# Patient Record
Sex: Male | Born: 1960 | Race: White | Hispanic: No | Smoking: Current every day smoker
Health system: Southern US, Community
[De-identification: ages and names within clinical notes are randomized; demographics above are authoritative.]

## PROBLEM LIST (undated history)

## (undated) DIAGNOSIS — I350 Nonrheumatic aortic (valve) stenosis: Secondary | ICD-10-CM

## (undated) DIAGNOSIS — I10 Essential (primary) hypertension: Secondary | ICD-10-CM

## (undated) DIAGNOSIS — C801 Malignant (primary) neoplasm, unspecified: Secondary | ICD-10-CM

## (undated) DIAGNOSIS — E119 Type 2 diabetes mellitus without complications: Secondary | ICD-10-CM

## (undated) DIAGNOSIS — D689 Coagulation defect, unspecified: Secondary | ICD-10-CM

## (undated) DIAGNOSIS — R0902 Hypoxemia: Secondary | ICD-10-CM

## (undated) DIAGNOSIS — J449 Chronic obstructive pulmonary disease, unspecified: Secondary | ICD-10-CM

## (undated) DIAGNOSIS — M1990 Unspecified osteoarthritis, unspecified site: Secondary | ICD-10-CM

## (undated) DIAGNOSIS — E785 Hyperlipidemia, unspecified: Secondary | ICD-10-CM

## (undated) DIAGNOSIS — G4730 Sleep apnea, unspecified: Secondary | ICD-10-CM

## (undated) HISTORY — DX: Coagulation defect, unspecified: D68.9

## (undated) HISTORY — DX: Hyperlipidemia, unspecified: E78.5

## (undated) HISTORY — DX: Malignant (primary) neoplasm, unspecified: C80.1

## (undated) HISTORY — DX: Chronic obstructive pulmonary disease, unspecified: J44.9

## (undated) HISTORY — DX: Essential (primary) hypertension: I10

## (undated) HISTORY — PX: KNEE SURGERY: SHX244

## (undated) HISTORY — DX: Sleep apnea, unspecified: G47.30

## (undated) HISTORY — DX: Unspecified osteoarthritis, unspecified site: M19.90

## (undated) HISTORY — DX: Hypoxemia: R09.02

## (undated) HISTORY — PX: OTHER SURGICAL HISTORY: SHX169

---

## 2013-06-29 ENCOUNTER — Emergency Department: Payer: Self-pay | Admitting: Emergency Medicine

## 2013-06-29 LAB — BASIC METABOLIC PANEL WITH GFR
Anion Gap: 6 — ABNORMAL LOW
BUN: 9 mg/dL
Calcium, Total: 9.6 mg/dL
Chloride: 99 mmol/L
Co2: 32 mmol/L
Creatinine: 0.67 mg/dL
EGFR (African American): >60
EGFR (Non-African Amer.): >60
Glucose: 285 mg/dL — ABNORMAL HIGH
Osmolality: 283
Potassium: 3.8 mmol/L
Sodium: 137 mmol/L

## 2013-06-29 LAB — CBC
HCT: 50.1 % (ref 40.0–52.0)
HGB: 17.1 g/dL (ref 13.0–18.0)
MCH: 30 pg (ref 26.0–34.0)
MCHC: 34.1 g/dL (ref 32.0–36.0)
MCV: 88 fL (ref 80–100)
PLATELETS: 242 10*3/uL (ref 150–440)
RBC: 5.71 10*6/uL (ref 4.40–5.90)
RDW: 13.9 % (ref 11.5–14.5)
WBC: 9.2 10*3/uL (ref 3.8–10.6)

## 2015-01-01 ENCOUNTER — Other Ambulatory Visit: Payer: Self-pay | Admitting: Internal Medicine

## 2015-01-01 DIAGNOSIS — G4730 Sleep apnea, unspecified: Secondary | ICD-10-CM

## 2015-01-01 DIAGNOSIS — J449 Chronic obstructive pulmonary disease, unspecified: Secondary | ICD-10-CM

## 2015-01-15 ENCOUNTER — Other Ambulatory Visit: Payer: Self-pay | Admitting: Internal Medicine

## 2015-01-15 ENCOUNTER — Ambulatory Visit
Admission: RE | Admit: 2015-01-15 | Discharge: 2015-01-15 | Disposition: A | Discharge status: Home / Self Care | Payer: Disability Insurance | Source: Ambulatory Visit | Attending: Internal Medicine | Admitting: Internal Medicine

## 2015-01-15 ENCOUNTER — Ambulatory Visit: Payer: Disability Insurance

## 2015-01-15 DIAGNOSIS — M47816 Spondylosis without myelopathy or radiculopathy, lumbar region: Secondary | ICD-10-CM | POA: Diagnosis not present

## 2015-01-15 DIAGNOSIS — I70208 Unspecified atherosclerosis of native arteries of extremities, other extremity: Secondary | ICD-10-CM | POA: Insufficient documentation

## 2015-01-15 DIAGNOSIS — M545 Low back pain: Secondary | ICD-10-CM | POA: Diagnosis present

## 2015-01-15 DIAGNOSIS — M5136 Other intervertebral disc degeneration, lumbar region: Secondary | ICD-10-CM

## 2015-01-15 DIAGNOSIS — M1712 Unilateral primary osteoarthritis, left knee: Secondary | ICD-10-CM | POA: Insufficient documentation

## 2015-01-15 DIAGNOSIS — M2240 Chondromalacia patellae, unspecified knee: Secondary | ICD-10-CM | POA: Insufficient documentation

## 2015-01-15 DIAGNOSIS — J449 Chronic obstructive pulmonary disease, unspecified: Secondary | ICD-10-CM

## 2015-01-15 DIAGNOSIS — G4730 Sleep apnea, unspecified: Secondary | ICD-10-CM

## 2015-01-15 DIAGNOSIS — J984 Other disorders of lung: Secondary | ICD-10-CM

## 2016-11-16 ENCOUNTER — Emergency Department: Payer: Medicare Other

## 2016-11-16 ENCOUNTER — Encounter: Payer: Self-pay | Admitting: Emergency Medicine

## 2016-11-16 ENCOUNTER — Emergency Department
Admission: EM | Admit: 2016-11-16 | Discharge: 2016-11-16 | Disposition: A | Discharge status: Home / Self Care | Payer: Medicare Other | Attending: Emergency Medicine | Admitting: Emergency Medicine

## 2016-11-16 DIAGNOSIS — F17210 Nicotine dependence, cigarettes, uncomplicated: Secondary | ICD-10-CM | POA: Diagnosis not present

## 2016-11-16 DIAGNOSIS — Z79899 Other long term (current) drug therapy: Secondary | ICD-10-CM | POA: Insufficient documentation

## 2016-11-16 DIAGNOSIS — J029 Acute pharyngitis, unspecified: Secondary | ICD-10-CM | POA: Diagnosis present

## 2016-11-16 DIAGNOSIS — Z7984 Long term (current) use of oral hypoglycemic drugs: Secondary | ICD-10-CM | POA: Insufficient documentation

## 2016-11-16 DIAGNOSIS — J020 Streptococcal pharyngitis: Secondary | ICD-10-CM | POA: Diagnosis not present

## 2016-11-16 DIAGNOSIS — E119 Type 2 diabetes mellitus without complications: Secondary | ICD-10-CM | POA: Diagnosis not present

## 2016-11-16 HISTORY — DX: Type 2 diabetes mellitus without complications: E11.9

## 2016-11-16 LAB — CBC WITH DIFFERENTIAL/PLATELET
Basophils Absolute: 0.1 10*3/uL (ref 0–0.1)
Basophils Relative: 1 %
Eosinophils Absolute: 0.1 10*3/uL (ref 0–0.7)
Eosinophils Relative: 0 %
HCT: 50.6 % (ref 40.0–52.0)
Hemoglobin: 17.1 g/dL (ref 13.0–18.0)
Lymphocytes Relative: 12 %
Lymphs Abs: 2.2 10*3/uL (ref 1.0–3.6)
MCH: 29.3 pg (ref 26.0–34.0)
MCHC: 33.8 g/dL (ref 32.0–36.0)
MCV: 86.5 fL (ref 80.0–100.0)
Monocytes Absolute: 1.3 10*3/uL — ABNORMAL HIGH (ref 0.2–1.0)
Monocytes Relative: 7 %
Neutro Abs: 14.8 10*3/uL — ABNORMAL HIGH (ref 1.4–6.5)
Neutrophils Relative %: 80 %
Platelets: 260 10*3/uL (ref 150–440)
RBC: 5.85 MIL/uL (ref 4.40–5.90)
RDW: 15.1 % — ABNORMAL HIGH (ref 11.5–14.5)
WBC: 18.5 10*3/uL — ABNORMAL HIGH (ref 3.8–10.6)

## 2016-11-16 LAB — COMPREHENSIVE METABOLIC PANEL
ALT: 16 U/L — ABNORMAL LOW (ref 17–63)
AST: 16 U/L (ref 15–41)
Albumin: 3.4 g/dL — ABNORMAL LOW (ref 3.5–5.0)
Alkaline Phosphatase: 87 U/L (ref 38–126)
Anion gap: 12 (ref 5–15)
BUN: 18 mg/dL (ref 6–20)
CO2: 26 mmol/L (ref 22–32)
Calcium: 9.2 mg/dL (ref 8.9–10.3)
Chloride: 95 mmol/L — ABNORMAL LOW (ref 101–111)
Creatinine, Ser: 0.65 mg/dL (ref 0.61–1.24)
GFR calc Af Amer: >60 mL/min (ref >60)
GFR calc non Af Amer: >60 mL/min (ref >60)
Glucose, Bld: 122 mg/dL — ABNORMAL HIGH (ref 65–99)
Potassium: 4 mmol/L (ref 3.5–5.1)
Sodium: 133 mmol/L — ABNORMAL LOW (ref 135–145)
Total Bilirubin: 1 mg/dL (ref 0.3–1.2)
Total Protein: 8.4 g/dL — ABNORMAL HIGH (ref 6.5–8.1)

## 2016-11-16 LAB — POCT RAPID STREP A: Streptococcus, Group A Screen (Direct): POSITIVE — AB

## 2016-11-16 MED ORDER — DEXTROSE 5 % IV SOLN
2.0000 g | Freq: Once | INTRAVENOUS | Status: AC
  Administered 2016-11-16: 2 g via INTRAVENOUS
  Filled 2016-11-16: qty 2

## 2016-11-16 MED ORDER — SODIUM CHLORIDE 0.9 % IV BOLUS (SEPSIS)
1000.0000 mL | Freq: Once | INTRAVENOUS | Status: AC
  Administered 2016-11-16: 1000 mL via INTRAVENOUS

## 2016-11-16 MED ORDER — PREDNISONE 10 MG (21) PO TBPK: ORAL_TABLET | Freq: Every day | ORAL | 0 refills | Status: AC

## 2016-11-16 MED ORDER — PIPERACILLIN-TAZOBACTAM 3.375 G IVPB 30 MIN
3.3750 g | Freq: Once | INTRAVENOUS | Status: AC
  Administered 2016-11-16: 3.375 g via INTRAVENOUS
  Filled 2016-11-16: qty 50

## 2016-11-16 MED ORDER — DEXAMETHASONE SODIUM PHOSPHATE 10 MG/ML IJ SOLN: 10.0000 mg | Freq: Once | INTRAMUSCULAR | Status: DC

## 2016-11-16 MED ORDER — AMOXICILLIN-POT CLAVULANATE 600-42.9 MG/5ML PO SUSR: 600.0000 mg | Freq: Two times a day (BID) | ORAL | 0 refills | Status: AC

## 2016-11-16 MED ORDER — AMOXICILLIN 400 MG/5ML PO SUSR: 500.0000 mg | Freq: Three times a day (TID) | ORAL | 0 refills | Status: AC

## 2016-11-16 MED ORDER — IOPAMIDOL (ISOVUE-300) INJECTION 61%
75.0000 mL | Freq: Once | INTRAVENOUS | Status: AC | PRN
  Administered 2016-11-16: 75 mL via INTRAVENOUS
  Filled 2016-11-16: qty 75

## 2016-11-16 MED ORDER — CEFTRIAXONE SODIUM 1 G IJ SOLR
2.0000 g | Freq: Once | INTRAMUSCULAR | Status: DC
  Filled 2016-11-16: qty 20

## 2016-11-16 MED ORDER — METHYLPREDNISOLONE SODIUM SUCC 125 MG IJ SOLR
125.0000 mg | Freq: Once | INTRAMUSCULAR | Status: AC
  Administered 2016-11-16: 125 mg via INTRAMUSCULAR
  Filled 2016-11-16 (×2): qty 2

## 2016-11-16 NOTE — ED Provider Notes (Signed)
[facility] Emergency Department Provider Note  ____________________________________________  Time seen: Approximately 5:04 PM  I have reviewed the triage vital signs and the nursing notes.   HISTORY  Chief Complaint Sore Throat    HPI Jerome [name] is a 56 y.o. male with a history of diabetes presents to the emergency department with 10 out of 10 pharyngitis for the past 3 days. Patient has noticed tonsillar exudate. Patient states that he has experienced sweats and chills.Patient states that he has been unable to swallow. Patient states that he can't even "manage a small amount of water". Patient denies a history of peritonsillar abscesses. He has noticed no voice changes. Patient states that he has been coughing since pharyngitis started. Patient denies associated chest pain, chest tightness, nausea, vomiting or diarrhea. No alleviating measures have been attempted.   Past Medical History:  Diagnosis Date  . Diabetes mellitus without complication (HCC)     There are no active problems to display for this patient.   Past Surgical History:  Procedure Laterality Date  . KNEE SURGERY Left     Prior to Admission medications   Medication Sig Start Date End Date Taking? Authorizing Provider  empagliflozin (JARDIANCE) 10 MG TABS tablet Take 10 mg by mouth daily.   Yes [provider]  glipiZIDE (GLUCOTROL) 10 MG tablet Take 10 mg by mouth daily before breakfast.   Yes [provider]  lisinopril (PRINIVIL,ZESTRIL) 40 MG tablet Take 40 mg by mouth daily.   Yes [provider]  metFORMIN (GLUCOPHAGE) 1000 MG tablet Take 1,000 mg by mouth 2 (two) times daily with a meal.   Yes [provider]  amoxicillin (AMOXIL) 400 MG/5ML suspension Take 6.3 mLs (500 mg total) by mouth 3 (three) times daily. 11/16/16 11/26/16  Orvil Feil, PA-C  amoxicillin-clavulanate (AUGMENTIN ES-600) 600-42.9 MG/5ML suspension Take 5 mLs (600 mg  total) by mouth 2 (two) times daily. 11/16/16 11/26/16  Orvil Feil, PA-C  predniSONE (STERAPRED UNI-PAK 21 TAB) 10 MG (21) TBPK tablet Take by mouth daily. Take 6 tabs the the 1st day. Take 6 tabs the the 2nd day. Take 5 tabs the the 3rd day. Take 5 tabs the 4th day. Take 4 tabs the the 5th day.Take 4 tabs the the 6th day.Take 3 tabs the 7th day.Take 3 tabs the 8th day. Take 2 tabs the 9th day. Take 2 tabs the 10th day. Take 1 tab the 11th day. Take 1 tab the 12th day. 11/16/16 11/28/16  Orvil Feil, PA-C    Allergies Patient has no known allergies.  No family history on file.  Social History Social History  Substance Use Topics  . Smoking status: Current Every Day Smoker    Packs/day: 0.30    Types: Cigarettes  . Smokeless tobacco: Not on file  . Alcohol use Not on file     Review of Systems  Constitutional: patient has had chills and swears. Eyes: No visual changes. No discharge ENT: patient has pharyngitis. Cardiovascular: no chest pain. Respiratory: Patient has productive cough Gastrointestinal: No abdominal pain.  No nausea, no vomiting.  No diarrhea.  No constipation. Genitourinary: Negative for dysuria. No hematuria Musculoskeletal: Negative for musculoskeletal pain. Skin: Negative for rash, abrasions, lacerations, ecchymosis. Neurological: Negative for headaches, focal weakness or numbness.  ____________________________________________   PHYSICAL EXAM:  VITAL SIGNS: ED Triage Vitals  Enc Vitals Group     BP 11/16/16 1611 (!) 142/101     Pulse Rate 11/16/16 1611 100  Resp 11/16/16 1611 20     Temp 11/16/16 1611 98.8 F (37.1 C)     Temp Source 11/16/16 1611 Oral     SpO2 11/16/16 1611 94 %     Weight 11/16/16 1612 (!) 345 lb (156.5 kg)     Height 11/16/16 1612 5\' 10"  (1.778 m)     Head Circumference --      Peak Flow --      Pain Score 11/16/16 1611 10     Pain Loc --      Pain Edu? --      Excl. in GC? --      Constitutional: Alert and oriented.  Well appearing and in no acute distress. Eyes: Conjunctivae are normal. PERRL. EOMI. Head: Atraumatic. ENT:      Ears: Tympanic membranes are pearly bilaterally.      Nose: No congestion/rhinnorhea.      Mouth/Throat: Mucous membranes are moist. Posterior pharynx is erythematous with the right tonsil larger than the left. Left tonsillar exudate visualized. Bilateral asymmetrical tonsillar hypertrophy visualized. Right tonsil 3+. Left tonsil 2+. Uvula is midline.  Neck: Full range of motion. Hematological/Lymphatic/Immunilogical: Palpable  cervical lymphadenopathy. Cardiovascular: Normal rate, regular rhythm. Normal S1 and S2.  Good peripheral circulation. Respiratory: Normal respiratory effort without tachypnea or retractions. Lungs CTAB. Good air entry to the bases with no decreased or absent breath sounds. Musculoskeletal: Full range of motion to all extremities. No gross deformities appreciated. Neurologic:  Normal speech and language. No gross focal neurologic deficits are appreciated.  Skin:  Skin is warm, dry and intact. No rash noted. Psychiatric: Mood and affect are normal. Speech and behavior are normal. Patient exhibits appropriate insight and judgement.   ____________________________________________   LABS (all labs ordered are listed, but only abnormal results are displayed)  Labs Reviewed  CBC WITH DIFFERENTIAL/PLATELET - Abnormal; Notable for the following:       Result Value   WBC 18.5 (*)    RDW 15.1 (*)    Neutro Abs 14.8 (*)    Monocytes Absolute 1.3 (*)    All other components within normal limits  COMPREHENSIVE METABOLIC PANEL - Abnormal; Notable for the following:    Sodium 133 (*)    Chloride 95 (*)    Glucose, Bld 122 (*)    Total Protein 8.4 (*)    Albumin 3.4 (*)    ALT 16 (*)    All other components within normal limits  POCT RAPID STREP A - Abnormal; Notable for the following:    Streptococcus, Group A Screen (Direct) POSITIVE (*)    All other  components within normal limits   ____________________________________________  EKG   ____________________________________________  RADIOLOGY Geraldo Pitter, personally viewed and evaluated these images as part of my medical decision making, as well as reviewing the written report by the radiologist.    Dg Chest 2 View  Result Date: 11/16/2016 CLINICAL DATA:  Sore throat for 2 days EXAM: CHEST  2 VIEW COMPARISON:  None. FINDINGS: Bilateral diffuse interstitial thickening and peribronchial cuffing most concerning for bronchitis. There is no focal consolidation. There is a linear band of airspace disease in the lingula likely reflecting atelectasis. There is no pleural effusion or pneumothorax. The heart and mediastinal contours are unremarkable. The osseous structures are unremarkable. IMPRESSION: Bilateral diffuse interstitial thickening and peribronchial cuffing most concerning for bronchitis. Electronically Signed   By: Elige Ko   On: 11/16/2016 17:36   Ct Soft Tissue Neck W Contrast  Result  Date: 11/16/2016 CLINICAL DATA:  Sore throat for 2 days. EXAM: CT NECK WITH CONTRAST TECHNIQUE: Multidetector CT imaging of the neck was performed using the standard protocol following the bolus administration of intravenous contrast. CONTRAST:  75mL ISOVUE-300 IOPAMIDOL (ISOVUE-300) INJECTION 61% COMPARISON:  None. FINDINGS: Pharynx and larynx: The palatine tonsils are enlarged and irregular. Scattered punctate calcifications are noted, left greater than right. Prominent adenoid tissue is noted. The nasopharyngeal airway is near completely occluded. The oropharyngeal airway is patent. Mucosal thickening extends along the area epiglottic fold bilaterally. Vocal cords are midline and symmetric. Salivary glands: The submandibular and parotid glands are within normal limits bilaterally. Thyroid: Normal Lymph nodes: Bilateral jugulodigastric lymph nodes are noted. Multiple subcentimeter level 2 lymph nodes  are present bilaterally, left greater than right. No significant adenopathy is present otherwise. Vascular: Dense atherosclerotic calcifications are present within the proximal internal carotid artery's bilaterally. Moderate stenosis is suspected on both sides. The contrast bolus is insufficient to quantify the degree of stenosis. Atherosclerotic calcifications are also present at the aortic arch without focal stenosis. Limited intracranial: Within normal limits. Visualized orbits: None Mastoids and visualized paranasal sinuses: The paranasal sinuses are clear. The right mastoid air cells have coalesced. Fluid or soft tissue is present within the right mastoid. There is marked thinning of the tegmen. Extension into the right middle cranial fossa is not excluded. Right middle ear cavity is clear. Skeleton: Multilevel degenerative changes are noted in the cervical spine. Osseous foraminal narrowing is most evident at C5-6 on the right and C5-6 and C6-7 on the left. No focal lytic or blastic lesions are present. No residual maxillary teeth are present. Prominent dental caries and periapical lucencies are noted in the central mandibular incisors. Other dental and periodontal disease is present as well. Upper chest: The lung apices are clear. The superior mediastinum is within normal limits. IMPRESSION: 1. Marked enlargement and irregularity of the palatine tonsils, adenoid tissue, and oropharyngeal mucosa suggesting acute pharyngitis. 2. Reactive type bilateral cervical adenopathy. 3. No discrete mass lesion. 4. Atherosclerotic calcifications with probable moderate proximal ICA stenosis of the bifurcations bilaterally. 5. Right its mastoid coalescence with fluid or soft tissue present and marked thinning of the tegmen with. Invasion into the middle cranial fossa is not excluded. MRI could be used for further evaluation if clinically indicated. 6. Multilevel spondylosis of the cervical spine. 7. Anterior mandibular  dental and periodontal disease as described. Electronically Signed   By: Marin Roberts M.D.   On: 11/16/2016 19:27    ____________________________________________    PROCEDURES  Procedure(s) performed:    Procedures    Medications  cefTRIAXone (ROCEPHIN) 2 g in dextrose 5 % 50 mL IVPB (not administered)  piperacillin-tazobactam (ZOSYN) IVPB 3.375 g (0 g Intravenous Stopped 11/16/16 1906)  sodium chloride 0.9 % bolus 1,000 mL (1,000 mLs Intravenous New Bag/Given 11/16/16 1742)  methylPREDNISolone sodium succinate (SOLU-MEDROL) 125 mg/2 mL injection 125 mg (125 mg Intramuscular Given 11/16/16 1743)  iopamidol (ISOVUE-300) 61 % injection 75 mL (75 mLs Intravenous Contrast Given 11/16/16 1843)     ____________________________________________   INITIAL IMPRESSION / ASSESSMENT AND PLAN / ED COURSE  Pertinent labs & imaging results that were available during my care of the patient were reviewed by me and considered in my medical decision making (see chart for details).  Review of the [place] CSRS was performed in accordance of the [facility] prior to dispensing any controlled drugs.     Assessment and plan: Pharyngitis: Patient presents to the emergency department  with 10 out of 10 pharyngitis for the past 3 days. On physical exam, patient had a right tonsillar enlargement, bilateral tonsillar hypertrophy and tonsillar exudate concerning for streptococcal pharyngitis and potential peritonsillar abscess. A CT soft tissue neck revealed no peritonsillar abscess but findings consistent with pharyngitis. WBC 18.  Dr. Jenne CampusMcQueen the otolaryngologist on-call, was consulted. Dr. Jenne CampusMcQueen reviewed CT scan and recommended Augmentin, amoxicillin and tapered prednisone outpatient. Patient was given 2 g of ceftriaxone and Zosyn in the emergency department. Patient was advised to follow-up with otolaryngology as needed. Strict return precautions were given. Patient voiced understanding regarding these return  precautions. All patient questions were answered.     ____________________________________________  FINAL CLINICAL IMPRESSION(S) / ED DIAGNOSES  Final diagnoses:  Pharyngitis due to Streptococcus species      NEW MEDICATIONS STARTED DURING THIS VISIT:  New Prescriptions   AMOXICILLIN (AMOXIL) 400 MG/5ML SUSPENSION    Take 6.3 mLs (500 mg total) by mouth 3 (three) times daily.   AMOXICILLIN-CLAVULANATE (AUGMENTIN ES-600) 600-42.9 MG/5ML SUSPENSION    Take 5 mLs (600 mg total) by mouth 2 (two) times daily.   PREDNISONE (STERAPRED UNI-PAK 21 TAB) 10 MG (21) TBPK TABLET    Take by mouth daily. Take 6 tabs the the 1st day. Take 6 tabs the the 2nd day. Take 5 tabs the the 3rd day. Take 5 tabs the 4th day. Take 4 tabs the the 5th day.Take 4 tabs the the 6th day.Take 3 tabs the 7th day.Take 3 tabs the 8th day. Take 2 tabs the 9th day. Take 2 tabs the 10th day. Take 1 tab the 11th day. Take 1 tab the 12th day.        This chart was dictated using voice recognition software/Dragon. Despite best efforts to proofread, errors can occur which can change the meaning. Any change was purely unintentional.    Orvil FeilWoods, Jaclyn M, PA-C 11/16/16 2017    Emily FilbertWilliams, Jonathan E, MD 11/16/16 2106

## 2016-11-16 NOTE — ED Notes (Signed)
See triage note  Developed sore throat couple of days ago  Pain has increased over the past couple of days

## 2016-11-16 NOTE — ED Triage Notes (Signed)
States sore throat x 2 days. Patient is in wheelchair due to history knee surgery and decreased mobility and therefore came by EMS.

## 2017-05-31 ENCOUNTER — Ambulatory Visit (INDEPENDENT_AMBULATORY_CARE_PROVIDER_SITE_OTHER): Payer: No Typology Code available for payment source | Admitting: Podiatry

## 2017-05-31 ENCOUNTER — Encounter: Payer: Self-pay | Admitting: Podiatry

## 2017-05-31 DIAGNOSIS — M79675 Pain in left toe(s): Secondary | ICD-10-CM

## 2017-05-31 DIAGNOSIS — B351 Tinea unguium: Secondary | ICD-10-CM

## 2017-05-31 DIAGNOSIS — M79674 Pain in right toe(s): Secondary | ICD-10-CM

## 2017-05-31 DIAGNOSIS — E1159 Type 2 diabetes mellitus with other circulatory complications: Secondary | ICD-10-CM

## 2017-05-31 NOTE — Progress Notes (Signed)
   Subjective:    Patient ID: Jerome [name], male    DOB: 08-Jul-1960, 56 y.o.   MRN: [id number]  HPIthis patient presents the office with chf complaint of long thick painful nails.  He says the nails are painful walking and wearing his shoes.  He says that he is diabetic and is  [facility].  He says that he was last seen by Dr. Graciela HusbandsKlein approximately 6 months ago and he has just now received permission to be seen in this office for his nails.  This patient is in a Oncologistmechanized wheelchair.  He says he is unable to self treat.  He presents to the office for an evaluation and treatment of his painful nails on both feet.    Review of Systems  Constitutional: Positive for unexpected weight change.  Eyes: Positive for visual disturbance.  Musculoskeletal: Positive for arthralgias, back pain, gait problem and myalgias.       Objective:   Physical Exam General Appearance  Alert, conversant and in no acute stress.  Vascular  Dorsalis pedis and posterior pulses are not  palpable  bilaterally.  Capillary return is within normal limits  Bilaterally. Temperature is within normal limits  Bilaterally  Neurologic  Deferred due to scooter.  Nails Thick disfigured discolored nails with subungual debride bilaterally from hallux to fifth toes bilaterally. No evidence of bacterial infection or drainage bilaterally.  Orthopedic  No limitations of motion of motion feet bilaterally.  No crepitus or effusions noted.  No bony pathology or digital deformities noted.  Skin  normotropic skin with no porokeratosis noted bilaterally.  No signs of infections or ulcers noted.          Assessment & Plan:  Onychomycosis  B/L  Diabetes with vascular complications.  IE  Debridement of nails.  RTC 3 months.   Helane GuntherGregory Bettey Muraoka DPM

## 2020-10-04 ENCOUNTER — Emergency Department: Payer: No Typology Code available for payment source

## 2020-10-04 ENCOUNTER — Other Ambulatory Visit: Payer: Self-pay

## 2020-10-04 ENCOUNTER — Encounter: Payer: Self-pay | Admitting: Emergency Medicine

## 2020-10-04 ENCOUNTER — Emergency Department
Admission: EM | Admit: 2020-10-04 | Discharge: 2020-10-04 | Disposition: A | Discharge status: Home / Self Care | Payer: No Typology Code available for payment source | Attending: Emergency Medicine | Admitting: Emergency Medicine

## 2020-10-04 DIAGNOSIS — E119 Type 2 diabetes mellitus without complications: Secondary | ICD-10-CM | POA: Insufficient documentation

## 2020-10-04 DIAGNOSIS — Z794 Long term (current) use of insulin: Secondary | ICD-10-CM | POA: Insufficient documentation

## 2020-10-04 DIAGNOSIS — F17210 Nicotine dependence, cigarettes, uncomplicated: Secondary | ICD-10-CM | POA: Diagnosis not present

## 2020-10-04 DIAGNOSIS — K529 Noninfective gastroenteritis and colitis, unspecified: Secondary | ICD-10-CM | POA: Diagnosis not present

## 2020-10-04 DIAGNOSIS — R109 Unspecified abdominal pain: Secondary | ICD-10-CM

## 2020-10-04 DIAGNOSIS — D72829 Elevated white blood cell count, unspecified: Secondary | ICD-10-CM | POA: Insufficient documentation

## 2020-10-04 DIAGNOSIS — Z7984 Long term (current) use of oral hypoglycemic drugs: Secondary | ICD-10-CM | POA: Diagnosis not present

## 2020-10-04 LAB — CBC
HCT: 54.8 % — ABNORMAL HIGH (ref 39.0–52.0)
Hemoglobin: 18.5 g/dL — ABNORMAL HIGH (ref 13.0–17.0)
MCH: 29.3 pg (ref 26.0–34.0)
MCHC: 33.8 g/dL (ref 30.0–36.0)
MCV: 86.7 fL (ref 80.0–100.0)
Platelets: 307 10*3/uL (ref 150–400)
RBC: 6.32 MIL/uL — ABNORMAL HIGH (ref 4.22–5.81)
RDW: 15.8 % — ABNORMAL HIGH (ref 11.5–15.5)
WBC: 13.6 10*3/uL — ABNORMAL HIGH (ref 4.0–10.5)
nRBC: 0 % (ref 0.0–0.2)

## 2020-10-04 LAB — COMPREHENSIVE METABOLIC PANEL
ALT: 18 U/L (ref 0–44)
AST: 16 U/L (ref 15–41)
Albumin: 3.4 g/dL — ABNORMAL LOW (ref 3.5–5.0)
Alkaline Phosphatase: 69 U/L (ref 38–126)
Anion gap: 9 (ref 5–15)
BUN: 15 mg/dL (ref 6–20)
CO2: 25 mmol/L (ref 22–32)
Calcium: 9.3 mg/dL (ref 8.9–10.3)
Chloride: 101 mmol/L (ref 98–111)
Creatinine, Ser: 0.56 mg/dL — ABNORMAL LOW (ref 0.61–1.24)
GFR, Estimated: >60 mL/min (ref >60)
Glucose, Bld: 73 mg/dL (ref 70–99)
Potassium: 4.2 mmol/L (ref 3.5–5.1)
Sodium: 135 mmol/L (ref 135–145)
Total Bilirubin: 0.6 mg/dL (ref 0.3–1.2)
Total Protein: 7.4 g/dL (ref 6.5–8.1)

## 2020-10-04 LAB — URINALYSIS, COMPLETE (UACMP) WITH MICROSCOPIC
Bacteria, UA: NONE SEEN
Bilirubin Urine: NEGATIVE
Glucose, UA: >=500 mg/dL — AB
Hgb urine dipstick: NEGATIVE
Ketones, ur: 5 mg/dL — AB
Leukocytes,Ua: NEGATIVE
Nitrite: NEGATIVE
Protein, ur: 100 mg/dL — AB
Specific Gravity, Urine: 1.028 (ref 1.005–1.030)
pH: 5 (ref 5.0–8.0)

## 2020-10-04 LAB — LIPASE, BLOOD: Lipase: 25 U/L (ref 11–51)

## 2020-10-04 MED ORDER — DICYCLOMINE HCL 10 MG PO CAPS: 10.0000 mg | ORAL_CAPSULE | Freq: Three times a day (TID) | ORAL | 0 refills | Status: DC | PRN

## 2020-10-04 MED ORDER — IOHEXOL 300 MG/ML  SOLN
125.0000 mL | Freq: Once | INTRAMUSCULAR | Status: AC | PRN
  Administered 2020-10-04: 125 mL via INTRAVENOUS

## 2020-10-04 MED ORDER — SUCRALFATE 1 G PO TABS: 1.0000 g | ORAL_TABLET | Freq: Four times a day (QID) | ORAL | 0 refills | Status: DC

## 2020-10-04 NOTE — ED Provider Notes (Signed)
[facility] Emergency Department Provider Note  ____________________________________________   I have reviewed the triage vital signs and the nursing notes.   HISTORY  Chief Complaint Abdominal Pain   History limited by: Not Limited   HPI Jerome [name] is a 60 y.o. male who presents to the emergency department today because of concern for right sided abdominal pain. The patient states that he started noticing a change to his GI tract a couple of months ago. Previously he had been very regular with his bowel movements, having them every morning without issue after having coffee. However a couple of months ago it started becoming less regular. He then started using a laxative powder to help. Over the past couple of weeks he started also developing some pain in his right abdomen. It would occur pretty soon after he ate. This morning when he started having the pain he noticed some swelling to the right mid abdomen. The patient states that the pain is more severe when he lays flat. Called his doctor who advised that he present to the emergency department.    Records reviewed. Per medical record review patient has a history of DM  Past Medical History:  Diagnosis Date  . Diabetes mellitus without complication (HCC)     There are no problems to display for this patient.   Past Surgical History:  Procedure Laterality Date  . KNEE SURGERY Left     Prior to Admission medications   Medication Sig Start Date End Date Taking? Authorizing Provider  albuterol (PROVENTIL HFA;VENTOLIN HFA) 108 (90 Base) MCG/ACT inhaler Inhale into the lungs.    [provider]  empagliflozin (JARDIANCE) 10 MG TABS tablet Take by mouth.    [provider]  glipiZIDE (GLUCOTROL) 10 MG tablet Take by mouth.    [provider]  insulin glargine (LANTUS) 100 UNIT/ML injection Inject into the skin.    [provider]  lisinopril (PRINIVIL,ZESTRIL) 40 MG  tablet Take by mouth.    [provider]  metFORMIN (GLUCOPHAGE) 1000 MG tablet Take by mouth.    [provider]  oxyCODONE-acetaminophen (PERCOCET/ROXICET) 5-325 MG tablet Take by mouth.    [provider]  varenicline (CHANTIX) 1 MG tablet Take by mouth.    [provider]  zolpidem (AMBIEN) 10 MG tablet Take by mouth.    [provider]    Allergies Patient has no known allergies.  No family history on file.  Social History Social History   Tobacco Use  . Smoking status: Current Every Day Smoker    Packs/day: 0.30    Types: Cigarettes  . Smokeless tobacco: Never Used  Substance Use Topics  . Alcohol use: No  . Drug use: No    Review of Systems Constitutional: No fever/chills Eyes: No visual changes. ENT: No sore throat. Cardiovascular: Denies chest pain. Respiratory: Denies shortness of breath. Gastrointestinal: Positive for abdominal pain.  Genitourinary: Negative for dysuria. Musculoskeletal: Negative for back pain. Skin: Negative for rash. Neurological: Negative for headaches, focal weakness or numbness.  ____________________________________________   PHYSICAL EXAM:  VITAL SIGNS: ED Triage Vitals  Enc Vitals Group     BP 10/04/20 1543 109/64     Pulse Rate 10/04/20 1543 99     Resp 10/04/20 1543 19     Temp 10/04/20 1543 98.6 F (37 C)     Temp Source 10/04/20 1543 Oral     SpO2 10/04/20 1543 94 %     Weight 10/04/20 1535 (!) 345  lb 0.3 oz (156.5 kg)     Height 10/04/20 1535 5\' 10"  (1.778 m)     Head Circumference --      Peak Flow --      Pain Score 10/04/20 1535 6   Constitutional: Alert and oriented.  Eyes: Conjunctivae are normal.  ENT      Head: Normocephalic and atraumatic.      Nose: No congestion/rhinnorhea.      Mouth/Throat: Mucous membranes are moist.      Neck: No stridor. Hematological/Lymphatic/Immunilogical: No cervical lymphadenopathy. Cardiovascular: Normal rate, regular rhythm.  No  murmurs, rubs, or gallops.  Respiratory: Normal respiratory effort without tachypnea nor retractions. Breath sounds are clear and equal bilaterally. No wheezes/rales/rhonchi. Gastrointestinal: Soft and non tender. No rebound. No guarding.  Genitourinary: Deferred Musculoskeletal: Normal range of motion in all extremities. No lower extremity edema. Neurologic:  Normal speech and language. No gross focal neurologic deficits are appreciated.  Skin:  Skin is warm, dry and intact. No rash noted. Psychiatric: Mood and affect are normal. Speech and behavior are normal. Patient exhibits appropriate insight and judgment.  ____________________________________________    LABS (pertinent positives/negatives)  CBC wbc 13.6, hgb 18.5, plt 307 Lipase 25 CMP wnl except cr 0.56, alb 3.4 UA clear, glucose >500, ketones 5, protein 100, rbc and wbc 0-5 ____________________________________________   EKG  None  ____________________________________________    RADIOLOGY  CT abd/pel Wall thickening of proximal jejunum.   ____________________________________________   PROCEDURES  Procedures  ____________________________________________   INITIAL IMPRESSION / ASSESSMENT AND PLAN / ED COURSE  Pertinent labs & imaging results that were available during my care of the patient were reviewed by me and considered in my medical decision making (see chart for details).   Patient presented to the emergency department because of concern for abdominal pain and some swelling to the right abdomen. On exam no significant swelling appreciated. Blood work with very minimal leukocytosis. The patient did have CT scan performed which showed some inflammation of the proximal jejunum, however no significant intraabdominal infection, obstruction or hernia. Discussed finding with patient. Will plan on discharging with medication to help with the patient's symptoms. [facility]  ____________________________________________   FINAL CLINICAL IMPRESSION(S) / ED DIAGNOSES  Final diagnoses:  Abdominal pain, unspecified abdominal location  Enteritis     Note: This dictation was prepared with Dragon dictation. Any transcriptional errors that result from this process are unintentional     10/06/20, MD 10/04/20 1931

## 2020-10-04 NOTE — Discharge Instructions (Addendum)
Please seek medical attention for any high fevers, chest pain, shortness of breath, change in behavior, persistent vomiting, bloody stool or any other new or concerning symptoms.  

## 2020-10-04 NOTE — ED Notes (Signed)
Pt loaded to wheelchair and rolled to lobby to await family pickup

## 2020-10-04 NOTE — ED Notes (Signed)
Pt presents to ED with c/o of ABD pain. Pt states intermittent nausea but nothing consistent. Pt denies fevers and chills. Pt states "I think my RLQ is swollen". Pt denies burning on urination. NAD noted.   Pt asks to remain in the recliner due to comfort.

## 2020-10-04 NOTE — ED Triage Notes (Signed)
C/O abdominal pain x 1 day.  Bulge to RLQ abdomen ( not appreciated by EMS).  ALso c/o pain to RLQ.  VS wnl.  NAD

## 2020-10-04 NOTE — ED Notes (Signed)
Peripheral IV discontinued. Catheter intact. No signs of infiltration or redness. Gauze applied to IV site.    Discharge instructions reviewed with patient. Questions fielded by this RN. Patient verbalizes understanding of instructions. Patient discharged home in stable condition per goodman. No acute distress noted at time of discharge.    

## 2022-11-11 IMAGING — CT CT ABD-PELV W/ CM
2 of 5 series · 17 of 46 positions shown, 19 images · IV contrast (APPLIED)
Comparison: None.

CLINICAL DATA: Right-sided abdominal pain

EXAM:
CT ABDOMEN AND PELVIS WITH CONTRAST
TECHNIQUE: Multidetector CT imaging of the abdomen and pelvis was performed
using the standard protocol following bolus administration of
intravenous contrast.
CONTRAST:  125mL OMNIPAQUE IOHEXOL 300 MG/ML  SOLN

[Series 2: axial st · axial · 0.93mm/px · z∈[-1175,-745]mm · 14 of 98 slices shown, 16 images]
[im 6/98  soft-tissue]
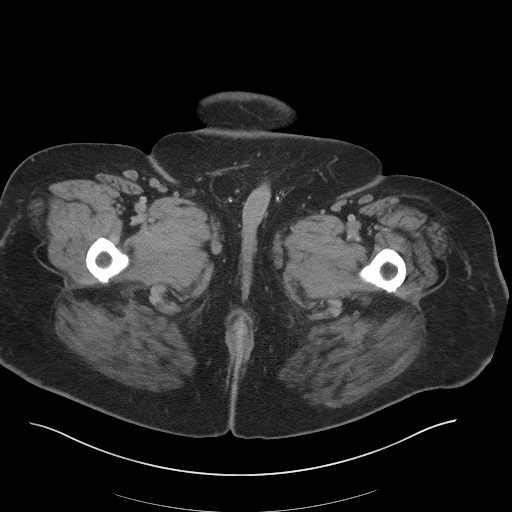
[im 6/98  bone]
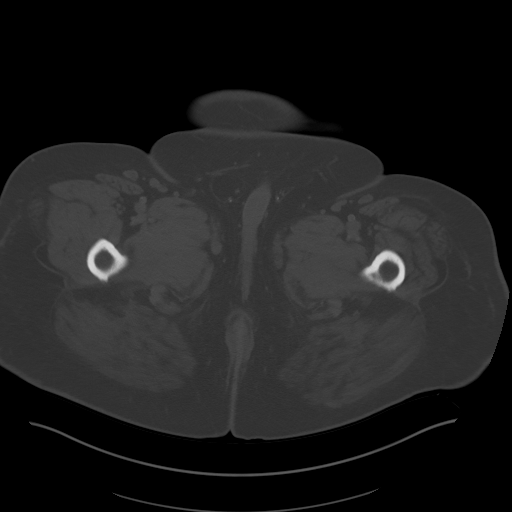
[im 11/98  soft-tissue]
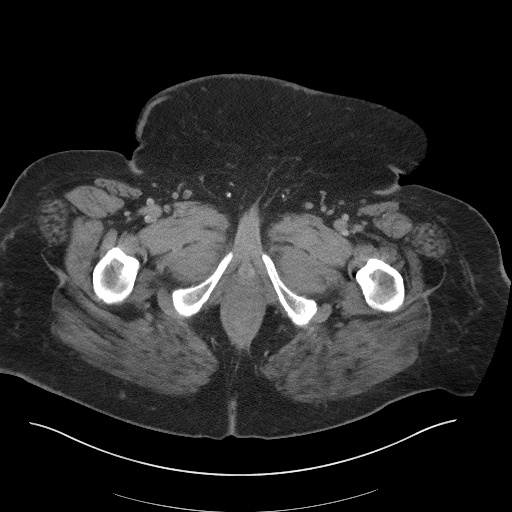
[im 22/98  soft-tissue]
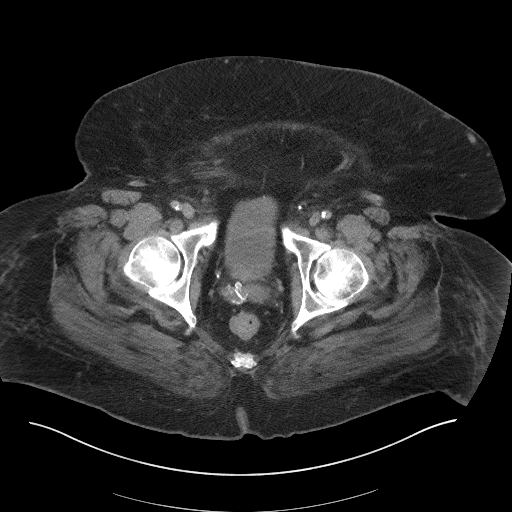
[im 27/98  soft-tissue]
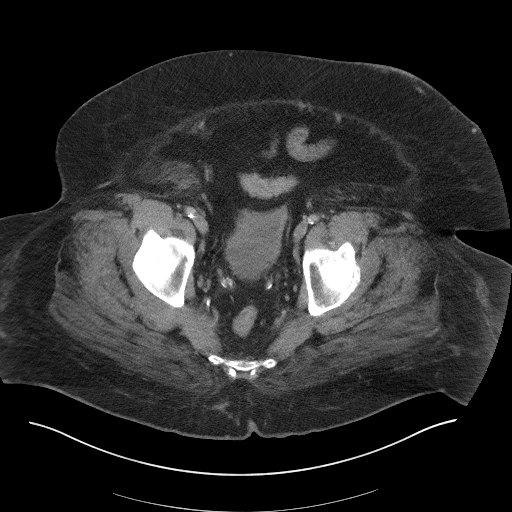
[im 33/98  soft-tissue]
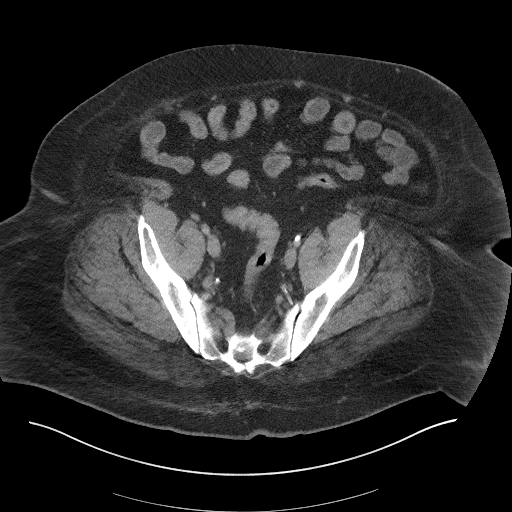
[im 38/98  soft-tissue]
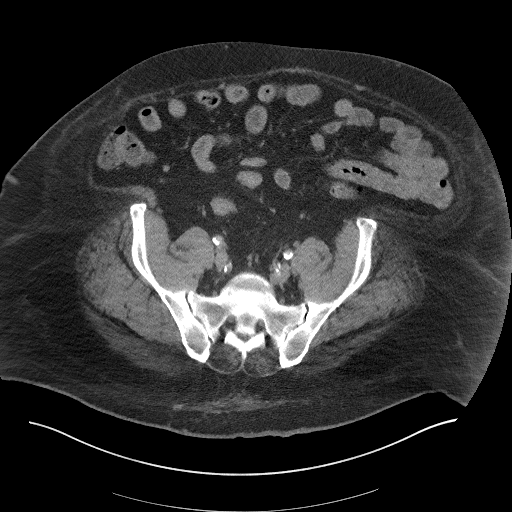
[im 44/98  soft-tissue]
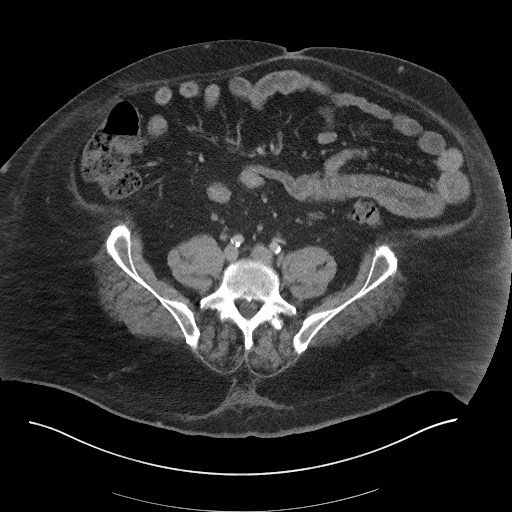
[im 54/98  soft-tissue]
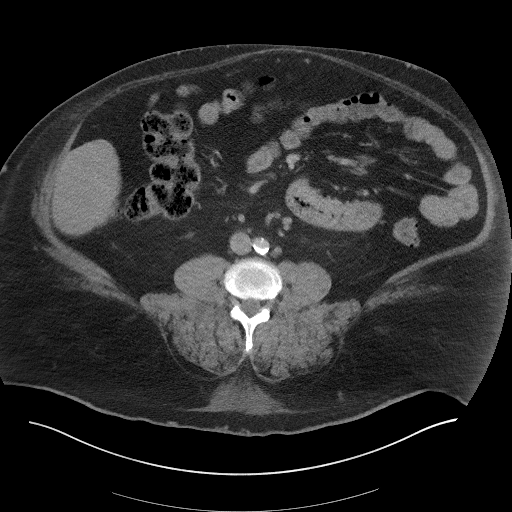
[im 60/98  soft-tissue]
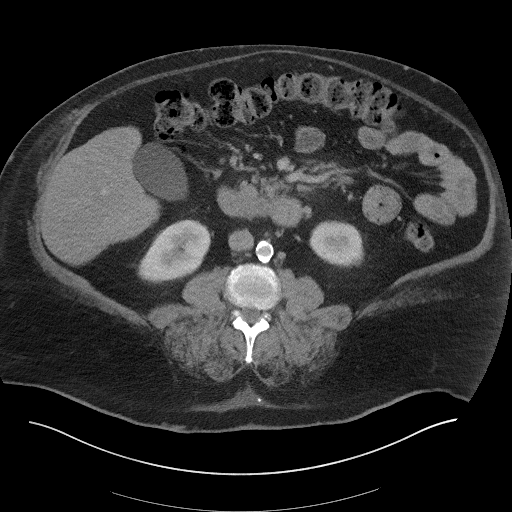
[im 60/98  bone]
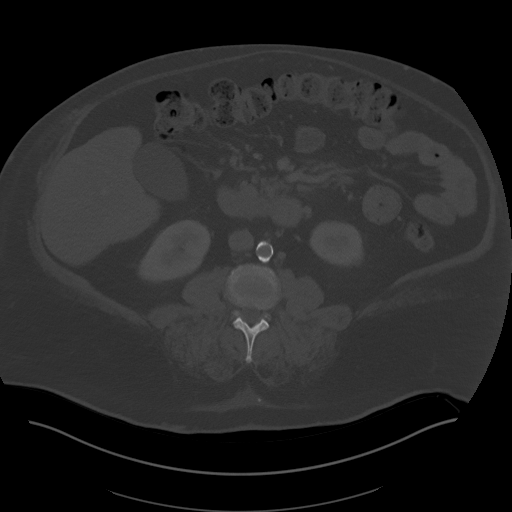
[im 65/98  soft-tissue]
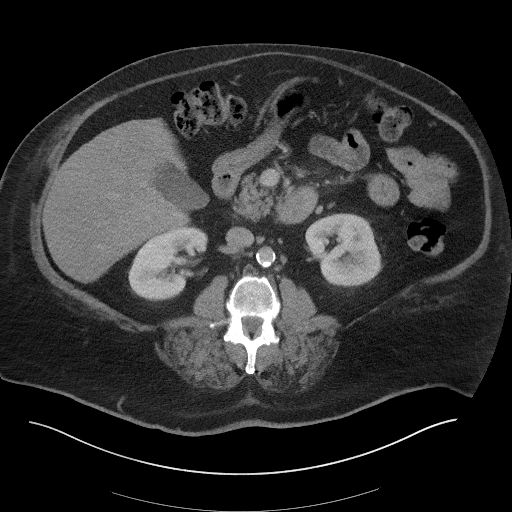
[im 71/98  soft-tissue]
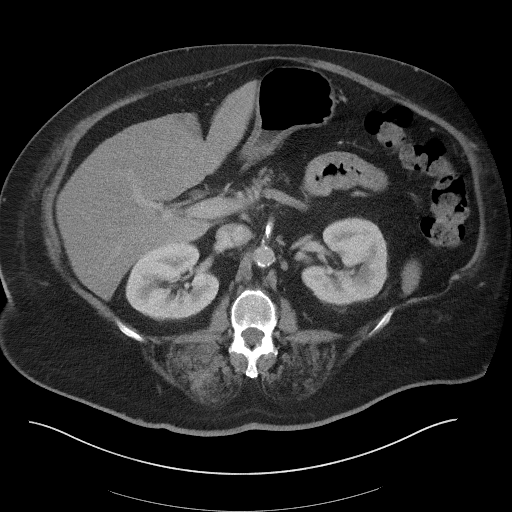
[im 76/98  soft-tissue]
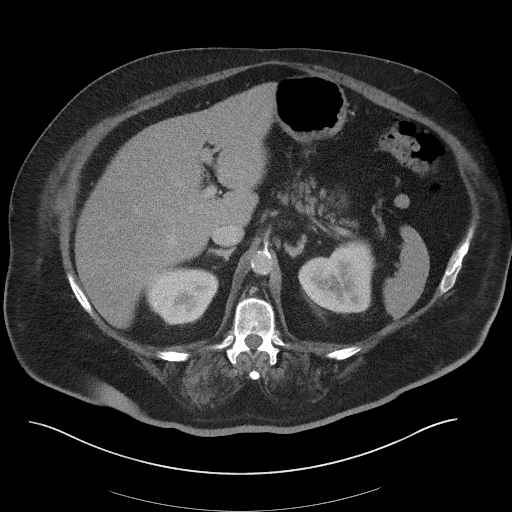
[im 87/98  soft-tissue]
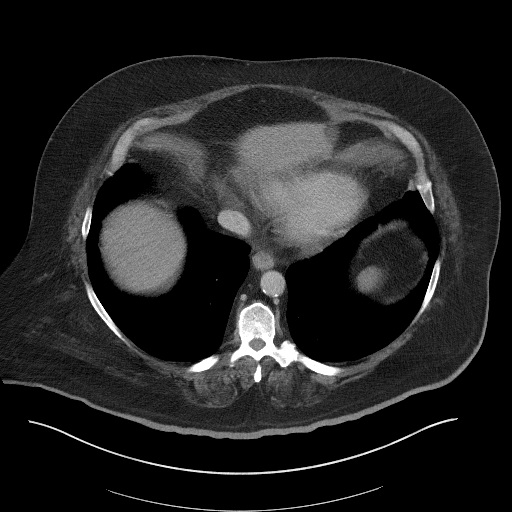
[im 92/98  soft-tissue]
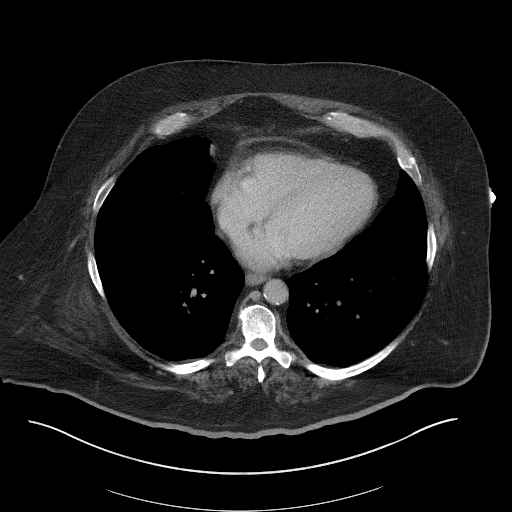

[Series 5: coronal st · coronal · 0.98mm/px · 3 of 124 slices shown]
[im 42/124  soft-tissue]
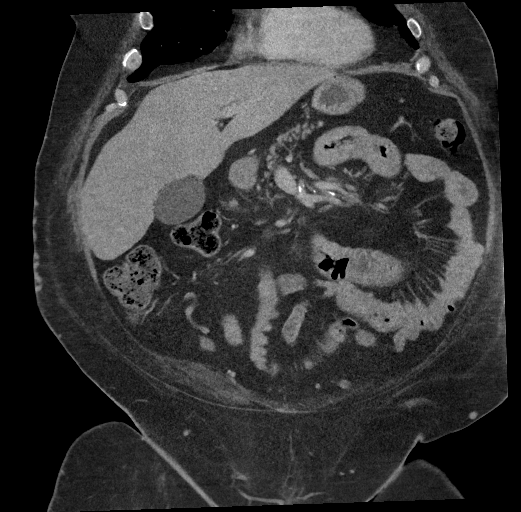
[im 55/124  soft-tissue]
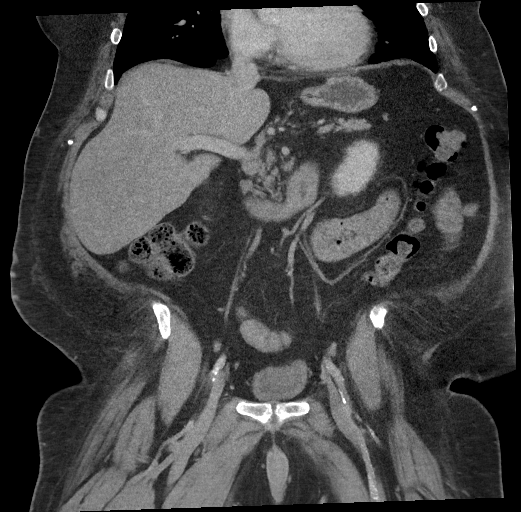
[im 69/124  soft-tissue]
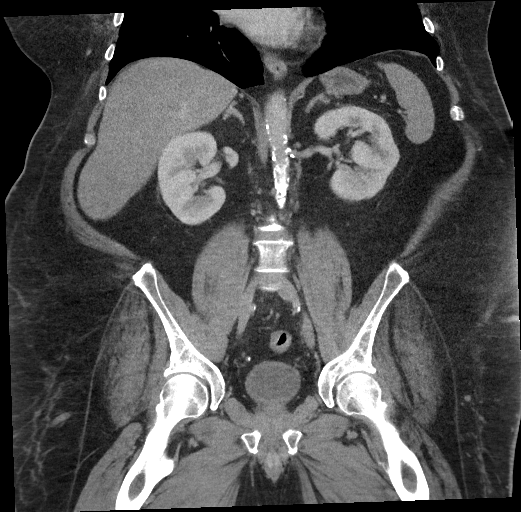

[17 of 46 positions shown; findings below may reference images not displayed]

FINDINGS: Lower chest: No acute abnormality.

Hepatobiliary: No focal liver abnormality is seen. No gallstones,
gallbladder wall thickening, or biliary dilatation. Calcified
granuloma noted within the left hepatic lobe.

Pancreas: Unremarkable. No pancreatic ductal dilatation or
surrounding inflammatory changes.

Spleen: Normal in size without focal abnormality.

Adrenals/Urinary Tract: Normal adrenal glands. Several small stones
identified within the lower pole collecting system of the right
kidney measuring up to 4 mm. No hydronephrosis or mass identified
bilaterally. Urinary bladder is unremarkable.

Stomach/Bowel: Stomach is within normal limits. Appendix appears
normal. Wall thickening and increase caliber of the proximal jejunum
identified concerning for inflammatory or infectious enteritis. The
remaining small bowel loops are unremarkable. Normal appearance of
the colon.

Vascular/Lymphatic: Aortic atherosclerosis. No aneurysm. No
abdominopelvic adenopathy

Reproductive: Prostate is unremarkable.

Other: No free fluid or fluid collections.

Musculoskeletal: No fracture is seen. Degenerative disc disease
noted within the lower thoracic spine.
IMPRESSION: 1. Wall thickening and increase caliber of the proximal jejunum
concerning for inflammatory or infectious enteritis.
2. No acute findings noted within the right lower quadrant of the
abdomen.
3. Nonobstructing right renal calculi.
4. Aortic atherosclerosis.

Aortic Atherosclerosis ([id number]).

## 2023-01-19 ENCOUNTER — Ambulatory Visit
Payer: No Typology Code available for payment source | Attending: Student in an Organized Health Care Education/Training Program | Admitting: Student in an Organized Health Care Education/Training Program

## 2023-01-19 ENCOUNTER — Encounter: Payer: Self-pay | Admitting: Student in an Organized Health Care Education/Training Program

## 2023-01-19 VITALS — BP 117/59 | HR 111 | Temp 99.1°F | Resp 18 | Ht 70.0 in | Wt 285.0 lb

## 2023-01-19 DIAGNOSIS — M25511 Pain in right shoulder: Secondary | ICD-10-CM | POA: Insufficient documentation

## 2023-01-19 DIAGNOSIS — G894 Chronic pain syndrome: Secondary | ICD-10-CM | POA: Insufficient documentation

## 2023-01-19 DIAGNOSIS — Z993 Dependence on wheelchair: Secondary | ICD-10-CM | POA: Diagnosis not present

## 2023-01-19 DIAGNOSIS — Z96652 Presence of left artificial knee joint: Secondary | ICD-10-CM | POA: Insufficient documentation

## 2023-01-19 DIAGNOSIS — M25562 Pain in left knee: Secondary | ICD-10-CM | POA: Insufficient documentation

## 2023-01-19 DIAGNOSIS — F17210 Nicotine dependence, cigarettes, uncomplicated: Secondary | ICD-10-CM | POA: Insufficient documentation

## 2023-01-19 DIAGNOSIS — Z7902 Long term (current) use of antithrombotics/antiplatelets: Secondary | ICD-10-CM | POA: Insufficient documentation

## 2023-01-19 DIAGNOSIS — M47816 Spondylosis without myelopathy or radiculopathy, lumbar region: Secondary | ICD-10-CM | POA: Insufficient documentation

## 2023-01-19 DIAGNOSIS — G8929 Other chronic pain: Secondary | ICD-10-CM | POA: Insufficient documentation

## 2023-01-19 DIAGNOSIS — M1712 Unilateral primary osteoarthritis, left knee: Secondary | ICD-10-CM | POA: Insufficient documentation

## 2023-01-19 DIAGNOSIS — I7090 Unspecified atherosclerosis: Secondary | ICD-10-CM | POA: Insufficient documentation

## 2023-01-19 DIAGNOSIS — M79642 Pain in left hand: Secondary | ICD-10-CM | POA: Insufficient documentation

## 2023-01-19 DIAGNOSIS — Z85118 Personal history of other malignant neoplasm of bronchus and lung: Secondary | ICD-10-CM | POA: Diagnosis not present

## 2023-01-19 DIAGNOSIS — M19011 Primary osteoarthritis, right shoulder: Secondary | ICD-10-CM | POA: Diagnosis not present

## 2023-01-19 DIAGNOSIS — M5459 Other low back pain: Secondary | ICD-10-CM | POA: Insufficient documentation

## 2023-01-19 DIAGNOSIS — M79641 Pain in right hand: Secondary | ICD-10-CM | POA: Diagnosis not present

## 2023-01-19 NOTE — Progress Notes (Signed)
Patient: Jerome [name]  Service Category: E/M  Provider: Edward Jolly, MD  DOB: 1960-06-26  DOS: 01/19/2023  Referring Provider: [facility]  MRN: [id number]  Setting: Ambulatory outpatient  PCP: [facility]  Type: New Patient  Specialty: Interventional Pain Management    Location: Office  Delivery: Face-to-face     Primary Reason(s) for Visit: Encounter for initial evaluation of one or more chronic problems (new to examiner) potentially causing chronic pain, and posing a threat to normal musculoskeletal function. (Level of risk: High) CC: Back Pain (lower), Shoulder Pain (right), Hand Pain (bilat), Knee Pain (left), and Foot Pain (Bilateral neuropathy)  HPI  Jerome [name] is a 62 y.o. year old, male patient, who comes for the first time to our practice referred by Center, [facility]* for our initial evaluation of his chronic pain. He has Chronic pain syndrome; Lumbar facet arthropathy; Chronic right shoulder pain; and Primary osteoarthritis of right shoulder on their problem list. Today he comes in for evaluation of his Back Pain (lower), Shoulder Pain (right), Hand Pain (bilat), Knee Pain (left), and Foot Pain (Bilateral neuropathy)  Pain Assessment: Location: Lower Back Radiating: denies Onset: More than a month ago Duration: Chronic pain Quality: Throbbing, Aching, Burning, Sharp, Shooting Severity: 3 /10 (subjective, self-reported pain score)  Effect on ADL: cannot lie flat; cannot walk more than 20 feet Timing: Constant Modifying factors: limits daily activities BP: (!) 117/59  HR: (!) 111  Onset and Duration: Present longer than 3 months Cause of pain: Unknown Severity: Getting worse, NAS-11 at its worse: 10/10, NAS-11 at its best: 3/10, and NAS-11 on the average: 4/10 Timing: Not influenced by the time of the day, During activity or exercise, After activity or exercise, and After a period of immobility Aggravating Factors:  not indicated Alleviating  Factors: Cold packs, Hot packs, and Medications Associated Problems: Numbness, Swelling, Tingling, Pain that wakes patient up, and Pain that does not allow patient to sleep Quality of Pain: Sharp and Uncomfortable Previous Examinations or Tests: CT scan, MRI scan, X-rays, and Orthopedic evaluation Previous Treatments: The patient denies    Jerome [name] is a 62 year old male who presents with multiple pain generators including his low back, right shoulder, 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  He also has a history of abdominal stents for mesenteric ischemia and is currently on Plavix.  He also has a history of lung cancer.  Patient states that he is wheelchair-bound and cannot ambulate without assistance.  He states that in 2015 he sustained a fall over ice and fell down many flights of stairs and injured his left knee which led him to have a left knee replacement.  He has low back pain related to lumbar spondylosis.  His lumbar MRI shows multilevel lumbar spondylosis most pronounced at L1 and L2 along with multilevel lumbar degenerative disc disease.  He has been diagnosed with right shoulder osteoarthritis and has done physical therapy in the past.  He is currently not on any opioid analgesics but will take 5 mg of oxycodone for breakthrough pain whenever it occurred.  No issues with compliance in the past he was prescribed this at the [place].   Historic Controlled Substance Pharmacotherapy Review  PMP and historical list of controlled substances: Oxycodone 5 mg daily [facility] Historical Monitoring: The patient  reports no history of drug use. List of prior UDS Testing: No results found for: "  MDMA", "COCAINSCRNUR", "PCPSCRNUR", "PCPQUANT", "CANNABQUANT", "THCU", "ETH", "CBDTHCR", "D8THCCBX", "D9THCCBX" Historical Background Evaluation: [place] PMP: PDMP reviewed during this encounter. Review of the past  63-months conducted.             PMP NARX Score Report:  [place] Department of public safety, offender search: Engineer, mining Information) Non-contributory Risk Assessment Profile: Aberrant behavior: None observed or detected today Risk factors for fatal opioid overdose: None identified today Fatal overdose hazard ratio (HR): Calculation deferred Non-fatal overdose hazard ratio (HR): Calculation deferred Risk of opioid abuse or dependence: 0.7-3.0% with doses ? 36 MME/day and 6.1-26% with doses ? 120 MME/day. Substance use disorder (SUD) risk level: Low   Pharmacologic Plan: As per protocol, I have not taken over any controlled substance management, pending the results of ordered tests and/or consults.            Initial impression: Pending review of available data and ordered tests.  Meds   Current Outpatient Medications:    albuterol (PROVENTIL HFA;VENTOLIN HFA) 108 (90 Base) MCG/ACT inhaler, Inhale into the lungs., Disp: , Rfl:    aspirin EC 81 MG tablet, Take 81 mg by mouth daily. Swallow whole., Disp: , Rfl:    ATORVASTATIN CALCIUM PO, Take by mouth., Disp: , Rfl:    clopidogrel (PLAVIX) 75 MG tablet, Take 75 mg by mouth daily., Disp: , Rfl:    Empagliflozin-metFORMIN HCl 12.10-998 MG TABS, Take by mouth., Disp: , Rfl:    gabapentin (NEURONTIN) 800 MG tablet, Take 800 mg by mouth daily as needed., Disp: , Rfl:    glipiZIDE (GLUCOTROL) 10 MG tablet, Take by mouth., Disp: , Rfl:    insulin glargine (LANTUS) 100 UNIT/ML injection, Inject into the skin., Disp: , Rfl:    lisinopril (PRINIVIL,ZESTRIL) 40 MG tablet, Take by mouth., Disp: , Rfl:    omeprazole (PRILOSEC) 20 MG capsule, Take 20 mg by mouth daily., Disp: , Rfl:    senna (SENOKOT) 8.6 MG TABS tablet, Take 1 tablet by mouth., Disp: , Rfl:    zolpidem (AMBIEN) 10 MG tablet, Take by mouth., Disp: , Rfl:   Imaging Review   DG Lumbar Spine 2-3 Views  Narrative CLINICAL DATA:  Low back pain.  EXAM: LUMBAR SPINE - 2-3  VIEW  COMPARISON:  None.  FINDINGS: Paraspinal soft tissues are normal. No acute bony abnormality identified. Normal alignment. Multilevel degenerative change. Aortoiliac atherosclerotic vascular disease.  IMPRESSION: 1.  Multilevel degenerative change.  No acute abnormality.  2.  Aortoiliac atherosclerotic vascular disease.   Electronically Signed By: Maisie Fus  Register On: 01/15/2015 15:42   DG Knee 1-2 Views Left  Narrative CLINICAL DATA:  previous patella surgery, difficulty walking and standing, left knee pain radiates all over  EXAM: LEFT KNEE - 1-2 VIEW  COMPARISON:  06/29/2013  FINDINGS: No acute fracture. Previous transverse fracture of the patella is noted. There is bony bridging across did portion of the fracture. Two screws have been inserted from superior to inferior, maintaining the patellar fracture fragments in near anatomic alignment. The screws are well-seated with no evidence of loosening. There is some irregularity along the subchondral surface of the dorsal patella consistent with posttraumatic chondromalacia.  There is moderate medial joint space compartment narrowing with medial compartment marginal spurring.  Bones are demineralized.  No bone lesion.  No joint effusion.  IMPRESSION: 1. No acute fracture. 2. Previous ORIF of the transverse patellar fracture. Two fixation screws are well-seated with no evidence of loosening. 3. Evidence of posttraumatic patellar chondromalacia. This may be the  source of chronic pain. 4. There is also moderate osteoarthritis affecting the medial compartment. Medial compartment findings are similar to the prior knee radiographs.   Electronically Signed By: Amie Portland M.D. On: 01/15/2015 15:48   Complexity Note: Imaging results reviewed.                         ROS  Cardiovascular: Daily Aspirin intake, High blood pressure, and Blood thinners:  Antiplatelet Pulmonary or Respiratory: Lung problems,  Difficulty blowing air out (Emphysema), Shortness of breath, Smoking, and Temporary stoppage of breathing during sleep Neurological: Abnormal skin sensations (Peripheral Neuropathy) Psychological-Psychiatric: No reported psychological or psychiatric signs or symptoms such as difficulty sleeping, anxiety, depression, delusions or hallucinations (schizophrenial), mood swings (bipolar disorders) or suicidal ideations or attempts Gastrointestinal: Vomiting blood (Ulcers), Reflux or heatburn, and Irregular, infrequent bowel movements (Constipation) Genitourinary: No reported renal or genitourinary signs or symptoms such as difficulty voiding or producing urine, peeing blood, non-functioning kidney, kidney stones, difficulty emptying the bladder, difficulty controlling the flow of urine, or chronic kidney disease Hematological: Bleeding easily Endocrine: High blood sugar requiring insulin (IDDM) Rheumatologic: No reported rheumatological signs and symptoms such as fatigue, joint pain, tenderness, swelling, redness, heat, stiffness, decreased range of motion, with or without associated rash Musculoskeletal: Negative for myasthenia gravis, muscular dystrophy, multiple sclerosis or malignant hyperthermia Work History: Disabled  Allergies  Jerome [name] has No Known Allergies.  Laboratory Chemistry Profile   Renal Lab Results  Component Value Date   BUN 15 10/04/2020   CREATININE 0.56 (L) 10/04/2020   GFRAA >60 11/16/2016   GFRNONAA >60 10/04/2020   PROTEINUR 100 (A) 10/04/2020     Electrolytes Lab Results  Component Value Date   NA 135 10/04/2020   K 4.2 10/04/2020   CL 101 10/04/2020   CALCIUM 9.3 10/04/2020     Hepatic Lab Results  Component Value Date   AST 16 10/04/2020   ALT 18 10/04/2020   ALBUMIN 3.4 (L) 10/04/2020   ALKPHOS 69 10/04/2020   LIPASE 25 10/04/2020     ID No results found for: "LYMEIGGIGMAB", "HIV", "SARSCOV2NAA", "STAPHAUREUS", "MRSAPCR", "HCVAB", "PREGTESTUR",  "RMSFIGG", "QFVRPH1IGG", "QFVRPH2IGG"   Bone No results found for: "VD25OH", "VD125OH2TOT", "VH8469GE9", "BM8413KG4", "25OHVITD1", "25OHVITD2", "25OHVITD3", "TESTOFREE", "TESTOSTERONE"   Endocrine Lab Results  Component Value Date   GLUCOSE 73 10/04/2020   GLUCOSEU >=500 (A) 10/04/2020     Neuropathy No results found for: "VITAMINB12", "FOLATE", "HGBA1C", "HIV"   CNS No results found for: "COLORCSF", "APPEARCSF", "RBCCOUNTCSF", "WBCCSF", "POLYSCSF", "LYMPHSCSF", "EOSCSF", "PROTEINCSF", "GLUCCSF", "JCVIRUS", "CSFOLI", "IGGCSF", "LABACHR", "ACETBL"   Inflammation (CRP: Acute  ESR: Chronic) No results found for: "CRP", "ESRSEDRATE", "LATICACIDVEN"   Rheumatology No results found for: "RF", "ANA", "LABURIC", "URICUR", "LYMEIGGIGMAB", "LYMEABIGMQN", "HLAB27"   Coagulation Lab Results  Component Value Date   PLT 307 10/04/2020     Cardiovascular Lab Results  Component Value Date   HGB 18.5 (H) 10/04/2020   HCT 54.8 (H) 10/04/2020     Screening No results found for: "SARSCOV2NAA", "COVIDSOURCE", "STAPHAUREUS", "MRSAPCR", "HCVAB", "HIV", "PREGTESTUR"   Cancer No results found for: "CEA", "CA125", "LABCA2"   Allergens No results found for: "ALMOND", "APPLE", "ASPARAGUS", "AVOCADO", "BANANA", "BARLEY", "BASIL", "BAYLEAF", "GREENBEAN", "LIMABEAN", "WHITEBEAN", "BEEFIGE", "REDBEET", "BLUEBERRY", "BROCCOLI", "CABBAGE", "MELON", "CARROT", "CASEIN", "CASHEWNUT", "CAULIFLOWER", "CELERY"     Note: Lab results reviewed.  PFSH  Drug: Jerome [name]  reports no history of drug use. Alcohol:  reports no history of alcohol use. Tobacco:  reports that he has  been smoking cigarettes. He has never used smokeless tobacco. Medical:  has a past medical history of Arthritis, Cancer (HCC), Clotting disorder (HCC), COPD (chronic obstructive pulmonary disease) (HCC), Diabetes mellitus without complication (HCC), Hyperlipidemia, Hypertension, Oxygen deficiency, and Sleep apnea. Family: family history is  not on file.  Past Surgical History:  Procedure Laterality Date   KNEE SURGERY Left    Active Ambulatory Problems    Diagnosis Date Noted   Chronic pain syndrome 01/19/2023   Lumbar facet arthropathy 01/19/2023   Chronic right shoulder pain 01/19/2023   Primary osteoarthritis of right shoulder 01/19/2023   Resolved Ambulatory Problems    Diagnosis Date Noted   No Resolved Ambulatory Problems   Past Medical History:  Diagnosis Date   Arthritis    Cancer (HCC)    Clotting disorder (HCC)    COPD (chronic obstructive pulmonary disease) (HCC)    Diabetes mellitus without complication (HCC)    Hyperlipidemia    Hypertension    Oxygen deficiency    Sleep apnea    Constitutional Exam  General appearance: alert, cooperative, and morbidly obese Vitals:   01/19/23 1358  BP: (!) 117/59  Pulse: (!) 111  Resp: 18  Temp: 99.1 F (37.3 C)  TempSrc: Temporal  SpO2: 95%  Weight: 285 lb (129.3 kg)  Height: 5\' 10"  (1.778 m)   BMI Assessment: Estimated body mass index is 40.89 kg/m as calculated from the following:   Height as of this encounter: 5\' 10"  (1.778 m).   Weight as of this encounter: 285 lb (129.3 kg).  BMI interpretation table: BMI level Category Range association with higher incidence of chronic pain  <18 kg/m2 Underweight   18.5-24.9 kg/m2 Ideal body weight   25-29.9 kg/m2 Overweight Increased incidence by 20%  30-34.9 kg/m2 Obese (Class I) Increased incidence by 68%  35-39.9 kg/m2 Severe obesity (Class II) Increased incidence by 136%  >40 kg/m2 Extreme obesity (Class III) Increased incidence by 254%   Patient's current BMI Ideal Body weight  Body mass index is 40.89 kg/m. Ideal body weight: 73 kg (160 lb 15 oz) Adjusted ideal body weight: 95.5 kg (210 lb 9 oz)   BMI Readings from Last 4 Encounters:  01/19/23 40.89 kg/m  10/04/20 44.48 kg/m  11/16/16 49.50 kg/m   Wt Readings from Last 4 Encounters:  01/19/23 285 lb (129.3 kg)  10/04/20 (!) 310 lb (140.6  kg)  11/16/16 (!) 345 lb (156.5 kg)    Psych/Mental status: Alert, oriented x 3 (person, place, & time)       Eyes: PERLA Respiratory: No evidence of acute respiratory distress  Cervical Spine Area Exam  Skin & Axial Inspection: No masses, redness, edema, swelling, or associated skin lesions Alignment: Symmetrical Functional ROM: Unrestricted ROM      Stability: No instability detected Muscle Tone/Strength: Functionally intact. No obvious neuro-muscular anomalies detected. Sensory (Neurological): Unimpaired Palpation: No palpable anomalies             Upper Extremity (UE) Exam    Side: Right upper extremity  Side: Left upper extremity  Skin & Extremity Inspection: Skin color, temperature, and hair growth are WNL. No peripheral edema or cyanosis. No masses, redness, swelling, asymmetry, or associated skin lesions. No contractures.  Skin & Extremity Inspection: Skin color, temperature, and hair growth are WNL. No peripheral edema or cyanosis. No masses, redness, swelling, asymmetry, or associated skin lesions. No contractures.  Functional ROM: Pain restricted ROM for shoulder  Functional ROM: Unrestricted ROM  Muscle Tone/Strength: Functionally intact. No obvious neuro-muscular anomalies detected.  Muscle Tone/Strength: Functionally intact. No obvious neuro-muscular anomalies detected.  Sensory (Neurological): Arthropathic arthralgia          Sensory (Neurological): Unimpaired          Palpation: No palpable anomalies              Palpation: No palpable anomalies              Provocative Test(s):  Phalen's test: deferred Tinel's test: deferred Apley's scratch test (touch opposite shoulder):  Action 1 (Across chest): Decreased ROM Action 2 (Overhead): Decreased ROM Action 3 (LB reach): Decreased ROM   Provocative Test(s):  Phalen's test: deferred Tinel's test: deferred Apley's scratch test (touch opposite shoulder):  Action 1 (Across chest): deferred Action 2 (Overhead):  deferred Action 3 (LB reach): deferred    Thoracic Spine Area Exam  Skin & Axial Inspection: No masses, redness, or swelling Alignment: Symmetrical Functional ROM: Unrestricted ROM Stability: No instability detected Muscle Tone/Strength: Functionally intact. No obvious neuro-muscular anomalies detected. Sensory (Neurological): Unimpaired Muscle strength & Tone: No palpable anomalies Lumbar Spine Area Exam  Skin & Axial Inspection: No masses, redness, or swelling Alignment: Symmetrical Functional ROM: Pain restricted ROM affecting both sides Stability: No instability detected Muscle Tone/Strength: Functionally intact. No obvious neuro-muscular anomalies detected. Sensory (Neurological): Musculoskeletal pain pattern   Gait & Posture Assessment  Ambulation: Patient came in today in a wheel chair Gait: Limited. Using assistive device to ambulate Posture: Difficulty standing up straight, due to pain  Lower Extremity Exam    Side: Right lower extremity  Side: Left lower extremity  Stability: No instability observed          Stability: No instability observed          Skin & Extremity Inspection: Skin color, temperature, and hair growth are WNL. No peripheral edema or cyanosis. No masses, redness, swelling, asymmetry, or associated skin lesions. No contractures.  Skin & Extremity Inspection: Evidence of prior arthroplastic surgery  Functional ROM: Unrestricted ROM                  Functional ROM: Pain restricted ROM for hip and knee joints          Muscle Tone/Strength: Functionally intact. No obvious neuro-muscular anomalies detected.  Muscle Tone/Strength: Functionally intact. No obvious neuro-muscular anomalies detected.  Sensory (Neurological): Unimpaired        Sensory (Neurological): Unimpaired        DTR: Patellar: deferred today Achilles: deferred today Plantar: deferred today  DTR: Patellar: deferred today Achilles: deferred today Plantar: deferred today  Palpation: No  palpable anomalies  Palpation: No palpable anomalies    Assessment  Primary Diagnosis & Pertinent Problem List: The primary encounter diagnosis was Chronic pain syndrome. Diagnoses of Lumbar spondylosis, Lumbar facet arthropathy, Chronic right shoulder pain, Primary osteoarthritis of right shoulder, and History of left knee replacement were also pertinent to this visit.  Visit Diagnosis (New problems to examiner): 1. Chronic pain syndrome   2. Lumbar spondylosis   3. Lumbar facet arthropathy   4. Chronic right shoulder pain   5. Primary osteoarthritis of right shoulder   6. History of left knee replacement    Plan of Care (Initial workup plan)  Note: Jerome [name] was reminded that as per protocol, today's visit has been an evaluation only. We have not taken over the patient's controlled substance management.  General Recommendations: The pain condition that the patient suffers from is best treated  with a multidisciplinary approach that involves an increase in physical activity to prevent de-conditioning and worsening of the pain cycle, as well as psychological counseling (formal and/or informal) to address the co-morbid psychological affects of pain. Treatment will often involve judicious use of pain medications and interventional procedures to decrease the pain, allowing the patient to participate in the physical activity that will ultimately produce long-lasting pain reductions. The goal of the multidisciplinary approach is to return the patient to a higher level of overall function and to restore their ability to perform activities of daily living.   Lab Orders         Compliance Drug Analysis, Ur      Discussed Qutenza for PDN Consider right suprascapular nerve block and possible RFA  Provider-requested follow-up: Return in about 16 days (around 02/04/2023) for 2nd pt visit.  Future Appointments  Date Time Provider Department Center  02/04/2023  1:15 PM Edward Jolly, MD [facility]  None     Duration of encounter: 60 minutes.  Total time on encounter, as per AMA guidelines included both the face-to-face and non-face-to-face time personally spent by the physician and/or other qualified health care professional(s) on the day of the encounter (includes time in activities that require the physician or other qualified health care professional and does not include time in activities normally performed by clinical staff). Physician's time may include the following activities when performed: Preparing to see the patient (e.g., pre-charting review of records, searching for previously ordered imaging, lab work, and nerve conduction tests) Review of prior analgesic pharmacotherapies. Reviewing PMP Interpreting ordered tests (e.g., lab work, imaging, nerve conduction tests) Performing post-procedure evaluations, including interpretation of diagnostic procedures Obtaining and/or reviewing separately obtained history Performing a medically appropriate examination and/or evaluation Counseling and educating the patient/family/caregiver Ordering medications, tests, or procedures Referring and communicating with other health care professionals (when not separately reported) Documenting clinical information in the electronic or other health record Independently interpreting results (not separately reported) and communicating results to the patient/ family/caregiver Care coordination (not separately reported)  Note by: Edward Jolly, MD (TTS technology used. I apologize for any typographical errors that were not detected and corrected.) Date: 01/19/2023; Time: 3:34 PM

## 2023-01-19 NOTE — Progress Notes (Signed)
Safety precautions to be maintained throughout the outpatient stay will include: orient to surroundings, keep bed in low position, maintain call bell within reach at all times, provide assistance with transfer out of bed and ambulation.  

## 2023-02-04 ENCOUNTER — Ambulatory Visit
Admission: RE | Admit: 2023-02-04 | Discharge: 2023-02-04 | Disposition: A | Discharge status: Home / Self Care | Payer: No Typology Code available for payment source | Source: Ambulatory Visit | Attending: Student in an Organized Health Care Education/Training Program | Admitting: Student in an Organized Health Care Education/Training Program

## 2023-02-04 ENCOUNTER — Encounter: Payer: Self-pay | Admitting: Student in an Organized Health Care Education/Training Program

## 2023-02-04 ENCOUNTER — Ambulatory Visit
Payer: No Typology Code available for payment source | Attending: Student in an Organized Health Care Education/Training Program | Admitting: Student in an Organized Health Care Education/Training Program

## 2023-02-04 VITALS — BP 122/62 | HR 95 | Temp 98.4°F | Resp 16 | Ht 70.0 in | Wt 275.0 lb

## 2023-02-04 DIAGNOSIS — Z7984 Long term (current) use of oral hypoglycemic drugs: Secondary | ICD-10-CM

## 2023-02-04 DIAGNOSIS — Z7902 Long term (current) use of antithrombotics/antiplatelets: Secondary | ICD-10-CM | POA: Diagnosis not present

## 2023-02-04 DIAGNOSIS — Z0289 Encounter for other administrative examinations: Secondary | ICD-10-CM | POA: Insufficient documentation

## 2023-02-04 DIAGNOSIS — G8929 Other chronic pain: Secondary | ICD-10-CM | POA: Diagnosis present

## 2023-02-04 DIAGNOSIS — G894 Chronic pain syndrome: Secondary | ICD-10-CM

## 2023-02-04 DIAGNOSIS — M19011 Primary osteoarthritis, right shoulder: Secondary | ICD-10-CM | POA: Diagnosis not present

## 2023-02-04 DIAGNOSIS — M25511 Pain in right shoulder: Secondary | ICD-10-CM

## 2023-02-04 DIAGNOSIS — E1140 Type 2 diabetes mellitus with diabetic neuropathy, unspecified: Secondary | ICD-10-CM | POA: Insufficient documentation

## 2023-02-04 DIAGNOSIS — Z993 Dependence on wheelchair: Secondary | ICD-10-CM | POA: Diagnosis not present

## 2023-02-04 DIAGNOSIS — M47816 Spondylosis without myelopathy or radiculopathy, lumbar region: Secondary | ICD-10-CM | POA: Diagnosis not present

## 2023-02-04 MED ORDER — OXYCODONE-ACETAMINOPHEN 5-325 MG PO TABS: 1.0000 | ORAL_TABLET | Freq: Every day | ORAL | 0 refills | Status: AC | PRN

## 2023-02-04 NOTE — Progress Notes (Signed)
PROVIDER NOTE: Information contained herein reflects review and annotations entered in association with encounter. Interpretation of such information and data should be left to medically-trained personnel. Information provided to patient can be located elsewhere in the medical record under "Patient Instructions". Document created using STT-dictation technology, any transcriptional errors that may result from process are unintentional.    Patient: Jerome [name]  Service Category: E/M  Provider: Edward Jolly, MD  DOB: 1961/04/02  DOS: 02/04/2023  Referring Provider: [facility]  MRN: [id number]  Specialty: Interventional Pain Management  PCP: [facility]  Type: Established Patient  Setting: Ambulatory outpatient    Location: Office  Delivery: Face-to-face     HPI  Jerome [name], a 62 y.o. year old male, is here today because of his Chronic right shoulder pain [M25.511, G89.29]. Jerome [name] primary complain today is Back Pain (low) and Shoulder Pain (right)  Pertinent problems: Jerome [name] does not have any pertinent problems on file. Pain Assessment: Severity of Chronic pain is reported as a 4 /10. Location: Back Lower/denies. Onset: More than a month ago. Quality:  (excrutiating). Timing: Intermittent. Modifying factor(s): limiting phyical activity. Vitals:  height is 5\' 10"  (1.778 m) and weight is 275 lb (124.7 kg). His temporal temperature is 98.4 F (36.9 C). His blood pressure is 122/62 and his pulse is 95. His respiration is 16 and oxygen saturation is 98%.  BMI: Estimated body mass index is 39.46 kg/m as calculated from the following:   Height as of this encounter: 5\' 10"  (1.778 m).   Weight as of this encounter: 275 lb (124.7 kg). Last encounter: 01/19/2023. Last procedure: Visit date not found.  Reason for encounter: medication management.2nd pt visit   No significant change in his medical history since his last visit.  He did get a new automated  wheelchair.  UDS appropriate.  Will also get updated right shoulder x-rays.  Discussed Qutenza for PDN.  Small prescription for Percocet to be taken for breakthrough pain only.  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  He also has a history of abdominal stents for mesenteric ischemia and is currently on Plavix.  He also has a history of lung cancer.  Patient states that he is wheelchair-bound and cannot ambulate without assistance.  He states that in 2015 he sustained a fall over ice and fell down many flights of stairs and injured his left knee which led him to have a left knee replacement.  He has low back pain related to lumbar spondylosis.  His lumbar MRI shows multilevel lumbar spondylosis most pronounced at L1 and L2 along with multilevel lumbar degenerative disc disease.  He has been diagnosed with right shoulder osteoarthritis and has done physical therapy in the past.  He is currently not on any opioid analgesics but will take 5 mg of oxycodone for breakthrough pain whenever it occurred.  No issues with compliance in the past he was prescribed this at the [place].    Pharmacotherapy Assessment  Analgesic: see below  Monitoring: [place] PMP: PDMP not reviewed this encounter.       Pharmacotherapy: No side-effects or adverse reactions reported. Compliance: No problems identified. Effectiveness: Clinically acceptable.  No notes on file  No results found for: "CBDTHCR" No results found for: "D8THCCBX" No results found for: "D9THCCBX"  UDS:  Summary  Date Value Ref Range Status  01/19/2023 Note  Final    Comment:    ==================================================================== Compliance Drug Analysis,  Ur ==================================================================== Test                             Result       Flag       Units  Drug Present and Declared for Prescription Verification   Gabapentin                     PRESENT      EXPECTED   Zolpidem Acid                  PRESENT      EXPECTED    Zolpidem acid is an expected metabolite of zolpidem.  Drug Absent but Declared for Prescription Verification   Salicylate                     Not Detected UNEXPECTED    Aspirin, as indicated in the declared medication list, is not always    detected even when used as directed.  ==================================================================== Test                      Result    Flag   Units      Ref Range   Creatinine              44               mg/dL      >=57 ==================================================================== Declared Medications:  The flagging and interpretation on this report are based on the  following declared medications.  Unexpected results may arise from  inaccuracies in the declared medications.   **Note: The testing scope of this panel includes these medications:   Gabapentin (Neurontin)   **Note: The testing scope of this panel does not include small to  moderate amounts of these reported medications:   Aspirin  Zolpidem (Ambien)   **Note: The testing scope of this panel does not include the  following reported medications:   Albuterol  Atorvastatin  Clopidogrel (Plavix)  Empagliflozin  Glipizide (Glucotrol)  Insulin (Lantus)  Lisinopril (Zestril)  Metformin  Omeprazole (Prilosec)  Sennosides (Senokot) ==================================================================== For clinical consultation, please call [phone]. ====================================================================       ROS  Constitutional: Denies any fever or chills Gastrointestinal: No reported hemesis, hematochezia, vomiting, or acute GI  distress Musculoskeletal: Denies any acute onset joint swelling, redness, loss of ROM, or weakness Neurological: No reported episodes of acute onset apraxia, aphasia, dysarthria, agnosia, amnesia, paralysis, loss of coordination, or loss of consciousness  Medication Review  Atorvastatin Calcium, Empagliflozin-metFORMIN HCl, albuterol, aspirin EC, clopidogrel, gabapentin, glipiZIDE, insulin glargine, lisinopril, omeprazole, oxyCODONE-acetaminophen, senna, and zolpidem  History Review  Allergy: Jerome [name] has No Known Allergies. Drug: Jerome [name]  reports no history of drug use. Alcohol:  reports no history of alcohol use. Tobacco:  reports that he has been smoking cigarettes. He has never used smokeless tobacco. Social: Jerome [name]  reports that he has been smoking cigarettes. He has never used smokeless tobacco. He reports that he does not drink alcohol and does not use drugs. Medical:  has a past medical history of Arthritis, Cancer (HCC), Clotting disorder (HCC), COPD (chronic obstructive pulmonary disease) (HCC), Diabetes mellitus without complication (HCC), Hyperlipidemia, Hypertension, Oxygen deficiency, and Sleep apnea. Surgical: Mr. Boyack  has a  past surgical history that includes Knee surgery (Left). Family: family history is not on file.  Laboratory Chemistry Profile   Renal Lab Results  Component Value Date   BUN 15 10/04/2020   CREATININE 0.56 (L) 10/04/2020   GFRAA >60 11/16/2016   GFRNONAA >60 10/04/2020    Hepatic Lab Results  Component Value Date   AST 16 10/04/2020   ALT 18 10/04/2020   ALBUMIN 3.4 (L) 10/04/2020   ALKPHOS 69 10/04/2020   LIPASE 25 10/04/2020    Electrolytes Lab Results  Component Value Date   NA 135 10/04/2020   K 4.2 10/04/2020   CL 101 10/04/2020   CALCIUM 9.3 10/04/2020    Bone No results found for: "VD25OH", "VD125OH2TOT", "ON6295MW4", "XL2440NU2", "25OHVITD1", "25OHVITD2", "25OHVITD3", "TESTOFREE", "TESTOSTERONE"  Inflammation (CRP:  Acute Phase) (ESR: Chronic Phase) No results found for: "CRP", "ESRSEDRATE", "LATICACIDVEN"       Note: Above Lab results reviewed.  Recent Imaging Review  CT ABDOMEN PELVIS W CONTRAST CLINICAL DATA:  Right-sided abdominal pain  EXAM: CT ABDOMEN AND PELVIS WITH CONTRAST  TECHNIQUE: Multidetector CT imaging of the abdomen and pelvis was performed using the standard protocol following bolus administration of intravenous contrast.  CONTRAST:  OMNIPAQUE IOHEXOL 300 MG/ML  SOLN  COMPARISON:  None.  FINDINGS: Lower chest: No acute abnormality.  Hepatobiliary: No focal liver abnormality is seen. No gallstones, gallbladder wall thickening, or biliary dilatation. Calcified granuloma noted within the left hepatic lobe.  Pancreas: Unremarkable. No pancreatic ductal dilatation or surrounding inflammatory changes.  Spleen: Normal in size without focal abnormality.  Adrenals/Urinary Tract: Normal adrenal glands. Several small stones identified within the lower pole collecting system of the right kidney measuring up to 4 mm. No hydronephrosis or mass identified bilaterally. Urinary bladder is unremarkable.  Stomach/Bowel: Stomach is within normal limits. Appendix appears normal. Wall thickening and increase caliber of the proximal jejunum identified concerning for inflammatory or infectious enteritis. The remaining small bowel loops are unremarkable. Normal appearance of the colon.  Vascular/Lymphatic: Aortic atherosclerosis. No aneurysm. No abdominopelvic adenopathy  Reproductive: Prostate is unremarkable.  Other: No free fluid or fluid collections.  Musculoskeletal: No fracture is seen. Degenerative disc disease noted within the lower thoracic spine.  IMPRESSION: 1. Wall thickening and increase caliber of the proximal jejunum concerning for inflammatory or infectious enteritis. 2. No acute findings noted within the right lower quadrant of the abdomen. 3.  Nonobstructing right renal calculi. 4. Aortic atherosclerosis.  Aortic Atherosclerosis (ICD10-I70.0).  Electronically Signed   By: Signa Kell M.D.   On: 10/04/2020 18:07 Note: Reviewed        Physical Exam  General appearance: Well nourished, well developed, and well hydrated. In no apparent acute distress Mental status: Alert, oriented x 3 (person, place, & time)       Respiratory: No evidence of acute respiratory distress Eyes: PERLA Vitals: BP 122/62   Pulse 95   Temp 98.4 F (36.9 C) (Temporal)   Resp 16   Ht 5\' 10"  (1.778 m)   Wt 275 lb (124.7 kg)   SpO2 98%   BMI 39.46 kg/m  BMI: Estimated body mass index is 39.46 kg/m as calculated from the following:   Height as of this encounter: 5\' 10"  (1.778 m).   Weight as of this encounter: 275 lb (124.7 kg). Ideal: Ideal body weight: 73 kg (160 lb 15 oz) Adjusted ideal body weight: 93.7 kg (206 lb 9 oz)  Cervical Spine Area Exam  Skin & Axial Inspection: No masses, redness,  edema, swelling, or associated skin lesions Alignment: Symmetrical Functional ROM: Unrestricted ROM      Stability: No instability detected Muscle Tone/Strength: Functionally intact. No obvious neuro-muscular anomalies detected. Sensory (Neurological): Unimpaired Palpation: No palpable anomalies             Upper Extremity (UE) Exam      Side: Right upper extremity   Side: Left upper extremity  Skin & Extremity Inspection: Skin color, temperature, and hair growth are WNL. No peripheral edema or cyanosis. No masses, redness, swelling, asymmetry, or associated skin lesions. No contractures.   Skin & Extremity Inspection: Skin color, temperature, and hair growth are WNL. No peripheral edema or cyanosis. No masses, redness, swelling, asymmetry, or associated skin lesions. No contractures.  Functional ROM: Pain restricted ROM for shoulder   Functional ROM: Unrestricted ROM          Muscle Tone/Strength: Functionally intact. No obvious neuro-muscular  anomalies detected.   Muscle Tone/Strength: Functionally intact. No obvious neuro-muscular anomalies detected.  Sensory (Neurological): Arthropathic arthralgia           Sensory (Neurological): Unimpaired          Palpation: No palpable anomalies               Palpation: No palpable anomalies              Provocative Test(s):  Phalen's test: deferred Tinel's test: deferred Apley's scratch test (touch opposite shoulder):  Action 1 (Across chest): Decreased ROM Action 2 (Overhead): Decreased ROM Action 3 (LB reach): Decreased ROM     Provocative Test(s):  Phalen's test: deferred Tinel's test: deferred Apley's scratch test (touch opposite shoulder):  Action 1 (Across chest): deferred Action 2 (Overhead): deferred Action 3 (LB reach): deferred      Thoracic Spine Area Exam  Skin & Axial Inspection: No masses, redness, or swelling Alignment: Symmetrical Functional ROM: Unrestricted ROM Stability: No instability detected Muscle Tone/Strength: Functionally intact. No obvious neuro-muscular anomalies detected. Sensory (Neurological): Unimpaired Muscle strength & Tone: No palpable anomalies Lumbar Spine Area Exam  Skin & Axial Inspection: No masses, redness, or swelling Alignment: Symmetrical Functional ROM: Pain restricted ROM affecting both sides Stability: No instability detected Muscle Tone/Strength: Functionally intact. No obvious neuro-muscular anomalies detected. Sensory (Neurological): Musculoskeletal pain pattern     Gait & Posture Assessment  Ambulation: Patient came in today in a wheel chair Gait: Limited. Using assistive device to ambulate Posture: Difficulty standing up straight, due to pain  Lower Extremity Exam      Side: Right lower extremity   Side: Left lower extremity  Stability: No instability observed           Stability: No instability observed          Skin & Extremity Inspection: Skin color, temperature, and hair growth are WNL. No peripheral edema or  cyanosis. No masses, redness, swelling, asymmetry, or associated skin lesions. No contractures.   Skin & Extremity Inspection: Evidence of prior arthroplastic surgery  Functional ROM: Unrestricted ROM                   Functional ROM: Pain restricted ROM for hip and knee joints          Muscle Tone/Strength: Functionally intact. No obvious neuro-muscular anomalies detected.   Muscle Tone/Strength: Functionally intact. No obvious neuro-muscular anomalies detected.  Sensory (Neurological): Unimpaired         Sensory (Neurological): Unimpaired        DTR: Patellar: deferred today Achilles: deferred  today Plantar: deferred today   DTR: Patellar: deferred today Achilles: deferred today Plantar: deferred today  Palpation: No palpable anomalies   Palpation: No palpable anomalies    Assessment   Diagnosis Status  1. Chronic right shoulder pain   2. Pain management contract signed   3. Primary osteoarthritis of right shoulder   4. Lumbar spondylosis   5. Lumbar facet arthropathy   6. Chronic pain syndrome   7. Chronic painful diabetic neuropathy (HCC)    Persistent Controlled Having a Flare-up   Updated Problems: No problems updated.  Plan of Care    Jerome [name] has a current medication list which includes the following long-term medication(s): albuterol, atorvastatin calcium, gabapentin, glipizide, insulin glargine, lisinopril, omeprazole, and zolpidem.  Pharmacotherapy (Medications Ordered): Meds ordered this encounter  Medications   oxyCODONE-acetaminophen (PERCOCET) 5-325 MG tablet    Sig: Take 1 tablet by mouth daily as needed for severe pain. Must last 30 days.    Dispense:  30 tablet    Refill:  0    Chronic Pain: STOP Act (Not applicable) Fill 1 day early if closed on refill date. Avoid benzodiazepines within 8 hours of opioids   Orders:  Orders Placed This Encounter  Procedures   NEUROLYSIS    Please order Qutenza patches from pharmacy    Standing Status:    Future    Standing Expiration Date:   05/07/2023    Order Specific Question:   Where will this procedure be performed?    Answer:   [facility]   DG Shoulder Right    Please make sure that the patient understands that this needs to be done as soon as possible. Never have the patient do the imaging "just before the next appointment". Inform patient that having the imaging done within the [facility] will expedite the availability of the results and will provide imaging availability to the requesting physician. In addition inform the patient that the imaging order has an expiration date and will not be renewed if not done within the active period.    Standing Status:   Future    Standing Expiration Date:   03/07/2023    Scheduling Instructions:     Imaging must be done as soon as possible. Inform patient that order will expire within 30 days and I will not renew it.    Order Specific Question:   Reason for Exam (SYMPTOM  OR DIAGNOSIS REQUIRED)    Answer:   Right shoulder pain    Order Specific Question:   Preferred imaging location?    Answer[facility]    [facility]    Order Specific Question:   Call Results- Best Contact Number?    Answer:   [phone] [facility])    Order Specific Question:   Release to patient    Answer:   Immediate  Continue with gabapentin as prescribed.   Follow-up plan:   Return in 20 days (on 02/24/2023) for Qutenza.     Recent Visits Date Type Provider Dept  01/19/23 Office Visit Edward Jolly, MD [facility]  Showing recent visits within past 90 days and meeting all other requirements Today's Visits Date Type Provider Dept  02/04/23 Office Visit Edward Jolly, MD [facility]  Showing today's visits and meeting all other requirements Future Appointments Date Type Provider Dept  02/24/23 Appointment Edward Jolly, MD [facility]  Showing future appointments within next 90 days and meeting all other  requirements  I discussed the  assessment and treatment plan with the patient. The patient was provided an opportunity to ask questions and all were answered. The patient agreed with the plan and demonstrated an understanding of the instructions.  Patient advised to call back or seek an in-person evaluation if the symptoms or condition worsens.  Duration of encounter:11minutes.  Total time on encounter, as per AMA guidelines included both the face-to-face and non-face-to-face time personally spent by the physician and/or other qualified health care professional(s) on the day of the encounter (includes time in activities that require the physician or other qualified health care professional and does not include time in activities normally performed by clinical staff). Physician's time may include the following activities when performed: Preparing to see the patient (e.g., pre-charting review of records, searching for previously ordered imaging, lab work, and nerve conduction tests) Review of prior analgesic pharmacotherapies. Reviewing PMP Interpreting ordered tests (e.g., lab work, imaging, nerve conduction tests) Performing post-procedure evaluations, including interpretation of diagnostic procedures Obtaining and/or reviewing separately obtained history Performing a medically appropriate examination and/or evaluation Counseling and educating the patient/family/caregiver Ordering medications, tests, or procedures Referring and communicating with other health care professionals (when not separately reported) Documenting clinical information in the electronic or other health record Independently interpreting results (not separately reported) and communicating results to the patient/ family/caregiver Care coordination (not separately reported)  Note by: Edward Jolly, MD Date: 02/04/2023; Time: 1:52 PM

## 2023-02-04 NOTE — Patient Instructions (Addendum)
Sign pain contract.

## 2023-02-24 ENCOUNTER — Ambulatory Visit
Payer: No Typology Code available for payment source | Attending: Student in an Organized Health Care Education/Training Program | Admitting: Student in an Organized Health Care Education/Training Program

## 2023-02-24 ENCOUNTER — Encounter: Payer: Self-pay | Admitting: Student in an Organized Health Care Education/Training Program

## 2023-02-24 VITALS — BP 108/45 | HR 92 | Temp 98.4°F | Resp 16 | Ht 70.0 in | Wt 275.0 lb

## 2023-02-24 DIAGNOSIS — E1140 Type 2 diabetes mellitus with diabetic neuropathy, unspecified: Secondary | ICD-10-CM | POA: Insufficient documentation

## 2023-02-24 MED ORDER — CAPSAICIN-CLEANSING GEL 8 % EX KIT
4.0000 | PACK | Freq: Once | CUTANEOUS | Status: AC
  Administered 2023-02-24: 4 via TOPICAL
  Filled 2023-02-24: qty 4

## 2023-02-24 NOTE — Patient Instructions (Signed)

## 2023-02-24 NOTE — Progress Notes (Signed)
PROVIDER NOTE: Interpretation of information contained herein should be left to medically-trained personnel. Specific patient instructions are provided elsewhere under "Patient Instructions" section of medical record. This document was created in part using STT-dictation technology, any transcriptional errors that may result from this process are unintentional.  Patient: Jerome [name] Type: Established DOB: 19-Oct-1960 MRN: [id number] PCP: [facility]  Service: Procedure DOS: 02/24/2023 Setting: Ambulatory Location: Ambulatory outpatient facility Delivery: Face-to-face Provider: Edward Jolly, MD Specialty: Interventional Pain Management Specialty designation: 09 Location: Outpatient facility Ref. Prov.: [facility]*       Interventional Therapy   Interventional Treatment:           Type: Qutenza Neurolysis #1  Laterality:  Bilateral Area treated: Feet Imaging Guidance: None Anesthesia/analgesia/anxiolysis/sedation: None required Medication (Right): Qutenza (capsaicin 8%) topical system Medication (Left): Qutenza (capsaicin 8%) topical system Date: 02/24/2023 Performed by: Edward Jolly, MD Rationale (medical necessity): procedure needed and proper for the treatment of Jerome [name]'s medical symptoms and needs. Indication: Painful diabetic peripheral neuralgia (DPN) (ICD-10-CM:E11.40) severe enough to impact quality of life or function. 1. Chronic painful diabetic neuropathy (HCC)    NAS-11 Pain score:   Pre-procedure: 3 /10   Post-procedure: 3 /10     Position / Prep / Materials:  Position: Supine  Materials: Qutenza Kit  H&P (Pre-op Assessment):  Jerome [name] is a 62 y.o. (year old), male patient, seen today for interventional treatment. He  has a past surgical history that includes Knee surgery (Left). Jerome [name] has a current medication list which includes the following prescription(s): albuterol, aspirin ec, atorvastatin calcium, clopidogrel,  empagliflozin-metformin hcl, gabapentin, glipizide, insulin glargine, lisinopril, omeprazole, oxycodone-acetaminophen, senna, and zolpidem. His primarily concern today is the No chief complaint on file.  Initial Vital Signs:  Pulse/HCG Rate: 92  Temp: 98.4 F (36.9 C) Resp: 16 BP: (!) 108/45 SpO2: 93 %  BMI: Estimated body mass index is 39.46 kg/m as calculated from the following:   Height as of this encounter: 5\' 10"  (1.778 m).   Weight as of this encounter: 275 lb (124.7 kg).  Risk Assessment: Allergies: Reviewed. He has No Known Allergies.  Allergy Precautions: None required Coagulopathies: Reviewed. None identified.  Blood-thinner therapy: None at this time Active Infection(s): Reviewed. None identified. Jerome [name] is afebrile  Site Confirmation: Jerome [name] was asked to confirm the procedure and laterality before marking the site Procedure checklist: Completed Consent: Before the procedure and under the influence of no sedative(s), amnesic(s), or anxiolytics, the patient was informed of the treatment options, risks and possible complications. To fulfill our ethical and legal obligations, as recommended by the American Medical Association's Code of Ethics, I have informed the patient of my clinical impression; the nature and purpose of the treatment or procedure; the risks, benefits, and possible complications of the intervention; the alternatives, including doing nothing; the risk(s) and benefit(s) of the alternative treatment(s) or procedure(s); and the risk(s) and benefit(s) of doing nothing. The patient was provided information about the general risks and possible complications associated with the procedure. These may include, but are not limited to: failure to achieve desired goals, infection, bleeding, organ or nerve damage, allergic reactions, paralysis, and death. In addition, the patient was informed of those risks and complications associated to the procedure, such as failure  to decrease pain; infection; bleeding; organ or nerve damage with subsequent damage to sensory, motor, and/or autonomic systems, resulting in permanent pain, numbness, and/or weakness of one or several areas of the body; allergic reactions; (  i.e.: anaphylactic reaction); and/or death. Furthermore, the patient was informed of those risks and complications associated with the medications. These include, but are not limited to: allergic reactions (i.e.: anaphylactic or anaphylactoid reaction(s)); adrenal axis suppression; blood sugar elevation that in diabetics may result in ketoacidosis or comma; water retention that in patients with history of congestive heart failure may result in shortness of breath, pulmonary edema, and decompensation with resultant heart failure; weight gain; swelling or edema; medication-induced neural toxicity; particulate matter embolism and blood vessel occlusion with resultant organ, and/or nervous system infarction; and/or aseptic necrosis of one or more joints. Finally, the patient was informed that Medicine is not an exact science; therefore, there is also the possibility of unforeseen or unpredictable risks and/or possible complications that may result in a catastrophic outcome. The patient indicated having understood very clearly. We have given the patient no guarantees and we have made no promises. Enough time was given to the patient to ask questions, all of which were answered to the patient's satisfaction. Jerome [name] has indicated that he wanted to continue with the procedure. Attestation: I, the ordering provider, attest that I have discussed with the patient the benefits, risks, side-effects, alternatives, likelihood of achieving goals, and potential problems during recovery for the procedure that I have provided informed consent. Date  Time: 02/24/2023 10:52 AM  Pre-Procedure Preparation:  Monitoring: As per clinic protocol. Respiration, ETCO2, SpO2, BP, heart rate and  rhythm monitor placed and checked for adequate function Safety Precautions: Patient was assessed for positional comfort and pressure points before starting the procedure. Time-out: I initiated and conducted the "Time-out" before starting the procedure, as per protocol. The patient was asked to participate by confirming the accuracy of the "Time Out" information. Verification of the correct person, site, and procedure were performed and confirmed by me, the nursing staff, and the patient. "Time-out" conducted as per Joint Commission's Universal Protocol (UP.01.01.01). Time: 1105 Start Time: 1115 hrs.  Description/Narrative of Procedure:          Region: Distal lower extremity Target Area: Sensory peripheral nerves affected by diabetic peripheral neuropathy Site: Feet Approach: Percutaneous  No./Series: Not applicable  Type: Percutaneous  Purpose: Therapeutic  Region: Distal lower extremities  Start Time: 1115 hrs.  Description of the Procedure: Protocol guidelines were followed. The patient was assisted into a comfortable position.  Informed consent was obtained in the patient monitored in the usual manner.  All questions were answered prior to the procedure.  They Qutenza patches were applied to the affected area and then covered with the wrap.  The Patient was kept under observation until the treatment was completed.  The patches were removed and the treated area was inspected.  Vitals:   02/24/23 1055  BP: (!) 108/45  Pulse: 92  Resp: 16  Temp: 98.4 F (36.9 C)  SpO2: 93%  Weight: 275 lb (124.7 kg)  Height: 5\' 10"  (1.778 m)     End Time: 1155 hrs.  Type of Imaging Technique: None used Indication(s): N/A Exposure Time: No patient exposure Contrast: None used. Fluoroscopic Guidance: N/A Ultrasound Guidance: N/A Interpretation: N/A  Post-operative Assessment:  Post-procedure Vital Signs:  Pulse/HCG Rate: 92  Temp: 98.4 F (36.9 C) Resp: 16 BP: (!) 108/45 SpO2: 93  %  EBL: None  Complications: No immediate post-treatment complications observed by team, or reported by patient.  Note: The patient tolerated the entire procedure well. A repeat set of vitals were taken after the procedure and the patient was kept under observation  following institutional policy, for this type of procedure. Post-procedural neurological assessment was performed, showing return to baseline, prior to discharge. The patient was provided with post-procedure discharge instructions, including a section on how to identify potential problems. Should any problems arise concerning this procedure, the patient was given instructions to immediately contact us, at any time, without hesitation. In any case, we plan to contact the patient by telephone for a follow-up status report regarding this interventional procedure.  Comments:  No additional relevant information.  Plan of Care (POC)  Orders:  No orders of the defined types were placed in this encounter.   Medications ordered for procedure: Meds ordered this encounter  Medications   capsaicin topical system 8 % patch 4 patch   Medications administered: We administered capsaicin topical system.  See the medical record for exact dosing, route, and time of administration.  Follow-up plan:   Return in about 10 weeks (around 05/05/2023).       Recent Visits Date Type Provider Dept  02/04/23 Office Visit Edward Jolly, MD [facility]  01/19/23 Office Visit Edward Jolly, MD [facility]  Showing recent visits within past 90 days and meeting all other requirements Today's Visits Date Type Provider Dept  02/24/23 Procedure visit Edward Jolly, MD [facility]  Showing today's visits and meeting all other requirements Future Appointments Date Type Provider Dept  05/05/23 Appointment Edward Jolly, MD [facility]  Showing future appointments within next 90 days and meeting all other  requirements  Disposition: Discharge home  Discharge (Date  Time): 02/24/2023; 1211 hrs.   Primary Care Physician: [facility] Location: [facility] Note by: Edward Jolly, MD (TTS technology used. I apologize for any typographical errors that were not detected and corrected.) Date: 02/24/2023; Time: 1:09 PM  Disclaimer:  Medicine is not an Visual merchandiser. The only guarantee in medicine is that nothing is guaranteed. It is important to note that the decision to proceed with this intervention was based on the information collected from the patient. The Data and conclusions were drawn from the patient's questionnaire, the interview, and the physical examination. Because the information was provided in large part by the patient, it cannot be guaranteed that it has not been purposely or unconsciously manipulated. Every effort has been made to obtain as much relevant data as possible for this evaluation. It is important to note that the conclusions that lead to this procedure are derived in large part from the available data. Always take into account that the treatment will also be dependent on availability of resources and existing treatment guidelines, considered by other Pain Management Practitioners as being common knowledge and practice, at the time of the intervention. For Medico-Legal purposes, it is also important to point out that variation in procedural techniques and pharmacological choices are the acceptable norm. The indications, contraindications, technique, and results of the above procedure should only be interpreted and judged by a Board-Certified Interventional Pain Specialist with extensive familiarity and expertise in the same exact procedure and technique.

## 2023-02-24 NOTE — Progress Notes (Signed)
Safety precautions to be maintained throughout the outpatient stay will include: orient to surroundings, keep bed in low position, maintain call bell within reach at all times, provide assistance with transfer out of bed and ambulation.  Call bell in reach.

## 2023-02-25 ENCOUNTER — Telehealth: Payer: Self-pay | Admitting: *Deleted

## 2023-02-25 NOTE — Telephone Encounter (Signed)
Attempted to call for post procedure follow-up. Message left. 

## 2023-03-15 ENCOUNTER — Ambulatory Visit (INDEPENDENT_AMBULATORY_CARE_PROVIDER_SITE_OTHER): Payer: No Typology Code available for payment source | Admitting: Neurology

## 2023-03-15 ENCOUNTER — Encounter: Payer: Self-pay | Admitting: Neurology

## 2023-03-15 VITALS — BP 111/66 | HR 100 | Ht 69.0 in | Wt 275.0 lb

## 2023-03-15 DIAGNOSIS — Z0289 Encounter for other administrative examinations: Secondary | ICD-10-CM

## 2023-03-15 DIAGNOSIS — R0601 Orthopnea: Secondary | ICD-10-CM

## 2023-03-15 DIAGNOSIS — Z9981 Dependence on supplemental oxygen: Secondary | ICD-10-CM

## 2023-03-15 DIAGNOSIS — Z85118 Personal history of other malignant neoplasm of bronchus and lung: Secondary | ICD-10-CM | POA: Diagnosis not present

## 2023-03-15 DIAGNOSIS — G4733 Obstructive sleep apnea (adult) (pediatric): Secondary | ICD-10-CM | POA: Diagnosis not present

## 2023-03-15 DIAGNOSIS — J449 Chronic obstructive pulmonary disease, unspecified: Secondary | ICD-10-CM

## 2023-03-15 NOTE — Progress Notes (Signed)
SLEEP MEDICINE CLINIC    Provider:  Melvyn Novas, MD  Primary Care Physician:  Center, [facility] [address] [place] [address]     Referring Provider: Nelta Numbers, Np [address] [place],  [place] [address]          Chief Complaint according to patient   Patient presents with:     New [facility] CPAP Patient (Initial Visit)      Has been on CPAP for over 10 years and had a phone visit with [facility] , and the [place] raised the question why he is on OXYGEN. Well, Oxygen was ordered through [place] q 10 years ago.  So , the [place] provider is sending this question to [place] ???      HISTORY OF PRESENT ILLNESS:  Jerome [name] is a 62 y.o. male patient in an electric scooter, who was wheelchair dependent for 8 years now is seen upon referral on 03/15/2023 from [place]  for a sleep consultation. The last sleep was about 10 years ago and ended with a new CPAP and oxygen at the time. He moved  from [facility] to [place] in 2014.   Chief concern according to patient :  "The [facility] questions my oxygen order from almost 10 years ago now ".         The patient had the first sleep study Through the [facility] [place],  He reports having received a new machine  in 2021 when respironics devices were recalled.  He had a RV by telephone only in June 20024_  BIPAP user , 100 % compliant.  9 h 44 minutes . Max 14/ min 10 cm water , EEP of 3 cm ASV or BiPAP, ?  Aircurve V auto resmed  AHI residual 0.8/ h !  On oxygen.      Sleep relevant medical history: sleeps in a power assisted recliner, has no ability to sleep in a regular bed. Cancer dx and radiation for lung cancer 2023, was much sleepier after that. 0-1 Nocturia/ no Enuresis.    COPD bronchial cancer right lung, radiation , chronic pain,  morbid obesity, poorly controlled DM,  secondary erythrocythemia.    Family medical /sleep history: brothers on CPAP with OSA, insomnia, sleep walkers.    Social history:  Patient is a [facility] patient for 20 years,  retired from  [employer],  Teacher, music.   and lives in a household alone. Has a home health aid and is unable to do ADL>  bathing, dressing,  food prep.  Widowed, 4 kids all living in other states. Sleeps with a cat.  Tobacco use: yes .  ETOH use ; no ,  Caffeine intake in form of Coffee( 2 in AM ) Soda(  mountain dew caffeine free /) Tea ( /) or energy drinks Exercise - none .        Sleep habits are as follows: The patient's dinner time is between 5-7 PM. The patient goes to sleep in a power recliner - 9 PM and continues to sleep for 7 hours, wakes between 4-6 AM. Dreams are reportedly frequent/vivid.   The patient wakes up spontaneously/ He reports not feeling refreshed or restored in AM, with symptoms such as dry mouth, morning headaches, and residual fatigue.  Naps are taken daily  lasting from 45-60 minutes and are more/ refreshing l- he is on oxygen when napping.     Review of Systems: Out of a complete 14 system review, the patient  complains of only the following symptoms, and all other reviewed systems are negative.:  Fatigue, sleepiness , snoring, fragmented sleep, Insomnia, Nocturia    How likely are you to doze in the following situations: 0 = not likely, 1 = slight chance, 2 = moderate chance, 3 = high chance   Sitting and Reading? Watching Television? Sitting inactive in a public place (theater or meeting)? As a passenger in a car for an hour without a break? Lying down in the afternoon when circumstances permit? Sitting and talking to someone? Sitting quietly after lunch without alcohol? In a car, while stopped for a few minutes in traffic?   Total = 8/ 24 points   FSS endorsed at na / 63 points.   Social History   Socioeconomic History   Marital status: Widoed after 20-25 years of marriage    Spouse name: Not on file   Number of children: Not on file 4 adult children    Years of education: Not on file   Highest education level: Not on file  Occupational History   Not on file   Tobacco Use   Smoking status: Every Day    Current packs/day: 20 a day     Types: Cigarettes   Smokeless tobacco: Never  Substance and Sexual Activity   Alcohol use: No   Drug use: No   Sexual activity: Not on file  Other Topics Concern   Not on file   needs assistance in DL, home health care aid,   Social History Narrative   Not on file   sleeps in a power recliner,  uses wheelchair and scooter.    Social Determinants of Health   Financial Resource Strain: Not on file  Food Insecurity: Not on file  Transportation Needs: Not on file  Physical Activity: Not on file  Stress: Not on file  Social Connections: Unknown (10/28/2021)   Received from [facility]   Social Network    Social Network: Not on file    OSA family history ; brother   Past Medical History:  Diagnosis Date   Arthritis    Cancer (HCC)    Clotting disorder (HCC)    COPD (chronic obstructive pulmonary disease) (HCC)    Diabetes mellitus without complication (HCC)    Hyperlipidemia    Hypertension    Oxygen deficiency    Sleep apnea     Past Surgical History:  Procedure Laterality Date   KNEE SURGERY Left      Current Outpatient Medications on File Prior to Visit  Medication Sig Dispense Refill   acetaminophen (TYLENOL) 325 MG tablet Take 650 mg by mouth every 6 (six) hours as needed for mild pain or moderate pain.     albuterol (PROVENTIL HFA;VENTOLIN HFA) 108 (90 Base) MCG/ACT inhaler Inhale into the lungs.     aspirin EC 81 MG tablet Take 81 mg by mouth daily. Swallow whole.     atorvastatin (LIPITOR) 80 MG tablet Take 80 mg by mouth daily.     clopidogrel (PLAVIX) 75 MG tablet Take 75 mg by mouth daily.     colchicine 0.6 MG tablet Take 0.6 mg by mouth 2 (two) times daily.     Empagliflozin-metFORMIN HCl 12.10-998 MG TABS Take 2 tablets by mouth every morning.     famotidine (PEPCID) 20 MG tablet Take 20 mg by mouth daily.     gabapentin (NEURONTIN) 800 MG tablet Take 800 mg by mouth 3  (three) times daily as needed (back and leg pain).  glipiZIDE (GLUCOTROL) 10 MG tablet Take 10 mg by mouth daily before breakfast.     insulin glargine (LANTUS) 100 UNIT/ML injection Inject into the skin.     lidocaine (LIDODERM) 5 % Place 1 patch onto the skin daily.     lisinopril (PRINIVIL,ZESTRIL) 40 MG tablet Take 40 mg by mouth daily.     morphine (MSIR) 15 MG tablet Take 15 mg by mouth 2 (two) times daily as needed for severe pain.     omeprazole (PRILOSEC) 40 MG capsule Take 40 mg by mouth daily.     prochlorperazine (COMPAZINE) 5 MG tablet Take 5 mg by mouth every 8 (eight) hours as needed for nausea.     senna (SENOKOT) 8.6 MG TABS tablet Take 1 tablet by mouth.     Tiotropium Bromide-Olodaterol 2.5-2.5 MCG/ACT AERS Inhale 2 puffs into the lungs daily at 12 noon.     varenicline (CHANTIX) 1 MG tablet Take 1 mg by mouth 2 (two) times daily.     zolpidem (AMBIEN) 10 MG tablet Take 10 mg by mouth at bedtime as needed for sleep (maximum of 3-5 tablet per week).     No current facility-administered medications on file prior to visit.    No Known Allergies   DIAGNOSTIC DATA (LABS, IMAGING, TESTING) - I reviewed patient records, labs, notes, testing and imaging myself where available.  Lab Results  Component Value Date   WBC 13.6 (H) 10/04/2020   HGB 18.5 (H) 10/04/2020   HCT 54.8 (H) 10/04/2020   MCV 86.7 10/04/2020   PLT 307 10/04/2020      Component Value Date/Time   NA 135 10/04/2020 1616   NA 137 06/29/2013 1137   K 4.2 10/04/2020 1616   K 3.8 06/29/2013 1137   CL 101 10/04/2020 1616   CL 99 06/29/2013 1137   CO2 25 10/04/2020 1616   CO2 32 06/29/2013 1137   GLUCOSE 73 10/04/2020 1616   GLUCOSE 285 (H) 06/29/2013 1137   BUN 15 10/04/2020 1616   BUN 9 06/29/2013 1137   CREATININE 0.56 (L) 10/04/2020 1616   CREATININE 0.67 06/29/2013 1137   CALCIUM 9.3 10/04/2020 1616   CALCIUM 9.6 06/29/2013 1137   PROT 7.4 10/04/2020 1616   ALBUMIN 3.4 (L) 10/04/2020 1616    AST 16 10/04/2020 1616   ALT 18 10/04/2020 1616   ALKPHOS 69 10/04/2020 1616   BILITOT 0.6 10/04/2020 1616   GFRNONAA >60 10/04/2020 1616   GFRNONAA >60 06/29/2013 1137   GFRAA >60 11/16/2016 1707   GFRAA >60 06/29/2013 1137   No results found for: "CHOL", "HDL", "LDLCALC", "LDLDIRECT", "TRIG", "CHOLHDL" No results found for: "HGBA1C" No results found for: "VITAMINB12" No results found for: "TSH"  PHYSICAL EXAM:  Today's Vitals   03/15/23 1119  BP: 111/66  Pulse: 100  Weight: 275 lb (124.7 kg)  Height: 5\' 9"  (1.753 m)   Body mass index is 40.61 kg/m.   Wt Readings from Last 3 Encounters:  03/15/23 275 lb (124.7 kg)  02/24/23 275 lb (124.7 kg)  02/04/23 275 lb (124.7 kg)     Ht Readings from Last 3 Encounters:  03/15/23 5\' 9"  (1.753 m)  02/24/23 5\' 10"  (1.778 m)  02/04/23 5\' 10"  (1.778 m)      General: The patient is awake, alert and appears not in acute distress. Not well groomed,  body odor.  Facial hir , edentulous,  Head: Normocephalic, atraumatic. Neck is supple. Mallampati 3,  neck circumference:19 inches . Nasal airflow barley patent.  Dental status: poor Cardiovascular:  Regular rate and cardiac rhythm by pulse,  without distended neck veins. Respiratory: tachypnoea.  Skin:  With evidence of ankle edema, - while seated on scooter.  Trunk: The patient's posture is relaxed.    NEUROLOGIC EXAM: The patient is awake and alert, oriented to place and time.   Memory subjective described as intact.  Attention span & concentration ability appears limited, logorrhea.  Speech is fluent,  with dysarthria, dysphonia . Mood and affect are appropriate.   Cranial nerves: no loss of smell or taste reported  Pupils are equal and briskly reactive to light. Funduscopic exam deferred.  Extraocular movements in vertical and horizontal planes were intact and without nystagmus. No Diplopia. Visual fields by finger perimetry are intact. Hearing was intact to soft voice and  finger rubbing.    Facial sensation intact to fine touch.  Facial motor strength is symmetric and tongue and uvula move midline.  Neck ROM : rotation, tilt and flexion extension were normal for age and shoulder shrug was symmetrical.    Motor exam:  patient reports  being wheelchair bound due to arthritis and trauma related bony injuries to knee, spine , shoulder.   Sensory: Proprioception tested in the upper extremities was normal.   Coordination: Rapid alternating movements in the fingers/of the left hands were of reduced speed, limited. . Left arm cannot be fully extended.  The Finger-to-nose maneuver was impaired  on the left    Gait and station:deferred.     ASSESSMENT AND PLAN 62 y.o. year old male  here with: question of continued need for PAP and oxygen.  Patient did not bring machine or attachments here.  Uses an 02 concentrator at home at 2 iters being bled into PAP>  He doesn't know the settings of his  PAP machine . Unsure if it is CPAP or BiPAP. Unable to sleep outside his power recliner , cannot be tested in lab .  Age of current device 2021 , after phillips recall.    1) Goal was to obtain HST for one night while not using  his PAP or 02 . He doubts being able to sleep not using PAP.  Will change to a HST while using PAP  but not 02 the same night-  And we need his machines downloads.   Unable to sleep outside his power recliner , cannot be tested in lab .   2) history of COPD related to smoking and Lung Cancer.  Must have had hypoxia to justify original 02 concentrator use - this was through the [place] .    I plan to follow up either personally or through our NP within 4- 6 months.   I would like to thank Center, [facility] and [place], Achille Rich, Np [address] [place],  [place] [address] for allowing me to meet with and to take care of this pleasant patient.   CC: I will share my notes with [facility], Randye Lobo , NP .  After spending a total time of  45  minutes  face to face and additional time for physical and neurologic examination, review of laboratory studies,  personal review of imaging studies, reports and results of other testing and review of referral information / records as far as provided in visit,   Electronically signed by: Melvyn Novas, MD 03/15/2023 11:21 AM  [facility] Board certified by The [employer] of Sleep Medicine and Diplomate of the [employer] of Sleep  Medicine. Board certified In Neurology through the ABPN, Fellow of the [employer] of Neurology.

## 2023-03-15 NOTE — Patient Instructions (Addendum)
Patient did not bring machine or attachments here.  Uses an 02 concentrator at home at 2 iters being bled into PAP>  He doesn't know the settings of his  PAP machine . Unsure if it is CPAP or BiPAP. Unable to sleep outside his power recliner , cannot be tested in lab .  Age of current device 2021 , after phillips recall.    1) Goal was to obtain HST for one night while not using  his PAP or 02 . He doubts being able to sleep not using PAP.  Will change to a HST while using PAP  but not 02 the same night-  this way we can at least answer the question of oxygen need , yes or no.  And we need his machines downloads.   Unable to sleep outside his power recliner , cannot be tested in lab .   2) history of COPD related to smoking and Lung Cancer.  Must have had hypoxia to justify original 02 concentrator use - this was through the [place] .

## 2023-03-26 ENCOUNTER — Telehealth: Payer: Self-pay | Admitting: Neurology

## 2023-03-26 NOTE — Telephone Encounter (Signed)
Pt said returning phone call to schedule sleep study. left a voicemail yesterday . Would like a call back.

## 2023-03-30 ENCOUNTER — Encounter: Payer: Self-pay | Admitting: Student in an Organized Health Care Education/Training Program

## 2023-03-30 ENCOUNTER — Ambulatory Visit
Payer: No Typology Code available for payment source | Attending: Student in an Organized Health Care Education/Training Program | Admitting: Student in an Organized Health Care Education/Training Program

## 2023-03-30 VITALS — BP 95/47 | HR 101 | Temp 100.0°F | Ht 67.0 in | Wt 275.0 lb

## 2023-03-30 DIAGNOSIS — E1142 Type 2 diabetes mellitus with diabetic polyneuropathy: Secondary | ICD-10-CM | POA: Insufficient documentation

## 2023-03-30 DIAGNOSIS — M25511 Pain in right shoulder: Secondary | ICD-10-CM | POA: Diagnosis present

## 2023-03-30 DIAGNOSIS — G8929 Other chronic pain: Secondary | ICD-10-CM | POA: Diagnosis present

## 2023-03-30 DIAGNOSIS — M51369 Other intervertebral disc degeneration, lumbar region without mention of lumbar back pain or lower extremity pain: Secondary | ICD-10-CM | POA: Diagnosis not present

## 2023-03-30 DIAGNOSIS — M19011 Primary osteoarthritis, right shoulder: Secondary | ICD-10-CM

## 2023-03-30 DIAGNOSIS — Z0289 Encounter for other administrative examinations: Secondary | ICD-10-CM | POA: Diagnosis not present

## 2023-03-30 DIAGNOSIS — E1140 Type 2 diabetes mellitus with diabetic neuropathy, unspecified: Secondary | ICD-10-CM

## 2023-03-30 DIAGNOSIS — Z7984 Long term (current) use of oral hypoglycemic drugs: Secondary | ICD-10-CM

## 2023-03-30 DIAGNOSIS — G894 Chronic pain syndrome: Secondary | ICD-10-CM

## 2023-03-30 DIAGNOSIS — M47816 Spondylosis without myelopathy or radiculopathy, lumbar region: Secondary | ICD-10-CM | POA: Diagnosis not present

## 2023-03-30 DIAGNOSIS — F17210 Nicotine dependence, cigarettes, uncomplicated: Secondary | ICD-10-CM | POA: Insufficient documentation

## 2023-03-30 MED ORDER — OXYCODONE-ACETAMINOPHEN 5-325 MG PO TABS: 1.0000 | ORAL_TABLET | Freq: Every day | ORAL | 0 refills | Status: AC | PRN

## 2023-03-30 NOTE — Progress Notes (Signed)
PROVIDER NOTE: Information contained herein reflects review and annotations entered in association with encounter. Interpretation of such information and data should be left to medically-trained personnel. Information provided to patient can be located elsewhere in the medical record under "Patient Instructions". Document created using STT-dictation technology, any transcriptional errors that may result from process are unintentional.    Patient: Jerome [name]  Service Category: E/M  Provider: Edward Jolly, MD  DOB: Apr 28, 1961  DOS: 03/30/2023  Referring Provider: [facility], [facility]  MRN: [id number]  Specialty: Interventional Pain Management  PCP: [facility]  Type: Established Patient  Setting: Ambulatory outpatient    Location: Office  Delivery: Face-to-face     HPI  [name], a 62 y.o. year old male, is here today because of his Lumbar spondylosis [M47.816]. [name] primary complain today is Peripheral Neuropathy (diabetic)  Pertinent problems: [name] has Chronic pain syndrome; Lumbar spondylosis; Chronic right shoulder pain; Primary osteoarthritis of right shoulder; Pain management contract signed; and Chronic painful diabetic neuropathy (HCC) on their pertinent problem list. Pain Assessment: Severity of Chronic pain is reported as a 3  (back is 3/10, neuropathy 2/10)/10. Location: Back Lower/denies. Onset: More than a month ago. Quality: Constant. Timing: Constant. Modifying factor(s): limits activityt. Vitals:  height is 5\' 7"  (1.702 m) and weight is 275 lb (124.7 kg). His temporal temperature is 100 F (37.8 C). His blood pressure is 95/47 (abnormal) and his pulse is 101 (abnormal). His oxygen saturation is 95%.  BMI: Estimated body mass index is 43.07 kg/m as calculated from the following:   Height as of this encounter: 5\' 7"  (1.702 m).   Weight as of this encounter: 275 lb (124.7 kg). Last encounter: 02/04/2023. Last procedure:  02/24/2023.  Reason for encounter: medication management.   Patient presents today for medication management.  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  [facility]  He states that he had a stent placed.  Access was gained through his left radial artery.  He states that helped with his excruciating abdominal pain.  During this time, he did utilize more oxycodone.  He was also prescribed morphine by the ED on a one-time occasion.  He presents to refill his oxycodone as below.  He continues to struggle with painful diabetic neuropathy of bilateral feet.  He also has low back pain related to lumbar spondylosis and lumbar degenerative disc disease.  Pharmacotherapy Assessment  Analgesic: Oxycodone 5 mg daily as needed  Monitoring: [place] PMP: PDMP reviewed during this encounter.       Pharmacotherapy: No side-effects or adverse reactions reported. Compliance: No problems identified. Effectiveness: Clinically acceptable.  Rickey Barbara, RN  03/30/2023  2:16 PM  Signed Safety precautions to be maintained throughout the outpatient stay will include: orient to surroundings, keep bed in low position, maintain call bell within reach at all times, provide assistance with transfer out of bed and ambulation.     No results found for: "CBDTHCR" No results found for: "D8THCCBX" No results found for: "D9THCCBX"  UDS:  Summary  Date Value Ref Range Status  01/19/2023 Note  Final    Comment:    ==================================================================== Compliance Drug Analysis, Ur ==================================================================== Test                             Result  Flag       Units  Drug Present and Declared for Prescription Verification   Gabapentin                     PRESENT      EXPECTED   Zolpidem Acid                  PRESENT       EXPECTED    Zolpidem acid is an expected metabolite of zolpidem.  Drug Absent but Declared for Prescription Verification   Salicylate                     Not Detected UNEXPECTED    Aspirin, as indicated in the declared medication list, is not always    detected even when used as directed.  ==================================================================== Test                      Result    Flag   Units      Ref Range   Creatinine              44               mg/dL      >=16 ==================================================================== Declared Medications:  The flagging and interpretation on this report are based on the  following declared medications.  Unexpected results may arise from  inaccuracies in the declared medications.   **Note: The testing scope of this panel includes these medications:   Gabapentin (Neurontin)   **Note: The testing scope of this panel does not include small to  moderate amounts of these reported medications:   Aspirin  Zolpidem (Ambien)   **Note: The testing scope of this panel does not include the  following reported medications:   Albuterol  Atorvastatin  Clopidogrel (Plavix)  Empagliflozin  Glipizide (Glucotrol)  Insulin (Lantus)  Lisinopril (Zestril)  Metformin  Omeprazole (Prilosec)  Sennosides (Senokot) ==================================================================== For clinical consultation, please call [phone]. ====================================================================       ROS  Constitutional: Denies any fever or chills Gastrointestinal: No reported hemesis, hematochezia, vomiting, or acute GI distress Musculoskeletal: Denies any acute onset joint swelling, redness, loss of ROM, or weakness Neurological:  Bilateral feet paresthesias  Medication Review  Empagliflozin-metFORMIN HCl, Tiotropium Bromide-Olodaterol, acetaminophen, albuterol, aspirin EC, atorvastatin, clopidogrel, colchicine,  famotidine, gabapentin, glipiZIDE, insulin glargine, lidocaine, lisinopril, omeprazole, oxyCODONE-acetaminophen, prochlorperazine, senna, varenicline, and zolpidem  History Review  Allergy: [name] has No Known Allergies. Drug: [name]  reports no history of drug use. Alcohol:  reports no history of alcohol use. Tobacco:  reports that he has been smoking cigarettes. He has never used smokeless tobacco. Social: [name]  reports that he has been smoking cigarettes. He has never used smokeless tobacco. He reports that he does not drink alcohol and does not use drugs. Medical:  has a past medical history of Arthritis, Cancer (HCC), Clotting disorder (HCC), COPD (chronic obstructive pulmonary disease) (HCC), Diabetes mellitus without complication (HCC), Hyperlipidemia, Hypertension, Oxygen deficiency, and Sleep apnea. Surgical: [name]  has a past surgical history that includes Knee surgery (Left). Family: family history is not on file.  Laboratory Chemistry Profile   Renal Lab Results  Component Value Date   BUN 15 10/04/2020   CREATININE 0.56 (L) 10/04/2020   GFRAA >60 11/16/2016   GFRNONAA >60 10/04/2020    Hepatic Lab Results  Component Value Date   AST 16 10/04/2020   ALT 18 10/04/2020  ALBUMIN 3.4 (L) 10/04/2020   ALKPHOS 69 10/04/2020   LIPASE 25 10/04/2020    Electrolytes Lab Results  Component Value Date   NA 135 10/04/2020   K 4.2 10/04/2020   CL 101 10/04/2020   CALCIUM 9.3 10/04/2020    Bone No results found for: "VD25OH", "VD125OH2TOT", "BJ4782NF6", "OZ3086VH8", "25OHVITD1", "25OHVITD2", "25OHVITD3", "TESTOFREE", "TESTOSTERONE"  Inflammation (CRP: Acute Phase) (ESR: Chronic Phase) No results found for: "CRP", "ESRSEDRATE", "LATICACIDVEN"       Note: Above Lab results reviewed.  Recent Imaging Review  DG Shoulder Right CLINICAL DATA:  Chronic pain syndrome.  Right shoulder pain.  EXAM: RIGHT SHOULDER - 2+ VIEW  COMPARISON:  None  Available.  FINDINGS: There is no evidence of fracture or dislocation. Minor acromioclavicular degenerative change. No erosion or bone destruction 5 mm soft tissue calcification adjacent to the lateral humeral head.  IMPRESSION: 1. Minor acromioclavicular degenerative change. 2. Soft tissue calcification adjacent to the lateral humeral head may represent calcific tendinopathy.  Electronically Signed   By: Narda Rutherford M.D.   On: 02/10/2023 14:53 Note: Reviewed        Physical Exam  General appearance: Well nourished, well developed, and well hydrated. In no apparent acute distress Mental status: Alert, oriented x 3 (person, place, & time)       Respiratory: No evidence of acute respiratory distress Eyes: PERLA Vitals: BP (!) 95/47   Pulse (!) 101   Temp 100 F (37.8 C) (Temporal)   Ht 5\' 7"  (1.702 m)   Wt 275 lb (124.7 kg)   SpO2 95%   BMI 43.07 kg/m  BMI: Estimated body mass index is 43.07 kg/m as calculated from the following:   Height as of this encounter: 5\' 7"  (1.702 m).   Weight as of this encounter: 275 lb (124.7 kg). Ideal: Ideal body weight: 66.1 kg (145 lb 11.6 oz) Adjusted ideal body weight: 89.6 kg (197 lb 6.9 oz)  Assessment   Diagnosis Status  1. Lumbar spondylosis   2. Chronic painful diabetic neuropathy (HCC)   3. Lumbar facet arthropathy   4. Chronic right shoulder pain   5. Primary osteoarthritis of right shoulder   6. Pain management contract signed   7. Chronic pain syndrome    Controlled Controlled Controlled   Updated Problems: No problems updated.  Plan of Care  Problem-specific:  No problem-specific Assessment & Plan notes found for this encounter.  [name] has a current medication list which includes the following long-term medication(s): albuterol, atorvastatin, colchicine, famotidine, gabapentin, glipizide, insulin glargine, lisinopril, omeprazole, prochlorperazine, and zolpidem.  Pharmacotherapy (Medications  Ordered): Meds ordered this encounter  Medications   oxyCODONE-acetaminophen (PERCOCET) 5-325 MG tablet    Sig: Take 1 tablet by mouth daily as needed for severe pain (pain score 7-10). Must last 30 days.    Dispense:  30 tablet    Refill:  0    Chronic Pain: STOP Act (Not applicable) Fill 1 day early if closed on refill date. Avoid benzodiazepines within 8 hours of opioids   Orders:  No orders of the defined types were placed in this encounter.  Follow-up plan:   Return if symptoms worsen or fail to improve.     Recent Visits Date Type Provider Dept  02/24/23 Procedure visit Edward Jolly, MD [facility]  02/04/23 Office Visit Edward Jolly, MD [facility]  01/19/23 Office Visit Edward Jolly, MD [facility]  Showing recent visits within past 90 days and meeting all other  requirements Today's Visits Date Type Provider Dept  03/30/23 Office Visit Edward Jolly, MD [facility]  Showing today's visits and meeting all other requirements Future Appointments Date Type Provider Dept  05/05/23 Appointment Edward Jolly, MD [facility]  Showing future appointments within next 90 days and meeting all other requirements  I discussed the assessment and treatment plan with the patient. The patient was provided an opportunity to ask questions and all were answered. The patient agreed with the plan and demonstrated an understanding of the instructions.  Patient advised to call back or seek an in-person evaluation if the symptoms or condition worsens.  Duration of encounter: .  Total time on encounter, as per AMA guidelines included both the face-to-face and non-face-to-face time personally spent by the physician and/or other qualified health care professional(s) on the day of the encounter (includes time in activities that require the physician or other qualified health care professional and does not include time in activities normally  performed by clinical staff). Physician's time may include the following activities when performed: Preparing to see the patient (e.g., pre-charting review of records, searching for previously ordered imaging, lab work, and nerve conduction tests) Review of prior analgesic pharmacotherapies. Reviewing PMP Interpreting ordered tests (e.g., lab work, imaging, nerve conduction tests) Performing post-procedure evaluations, including interpretation of diagnostic procedures Obtaining and/or reviewing separately obtained history Performing a medically appropriate examination and/or evaluation Counseling and educating the patient/family/caregiver Ordering medications, tests, or procedures Referring and communicating with other health care professionals (when not separately reported) Documenting clinical information in the electronic or other health record Independently interpreting results (not separately reported) and communicating results to the patient/ family/caregiver Care coordination (not separately reported)  Note by: Edward Jolly, MD Date: 03/30/2023; Time: 2:54 PM

## 2023-03-30 NOTE — Progress Notes (Signed)
Safety precautions to be maintained throughout the outpatient stay will include: orient to surroundings, keep bed in low position, maintain call bell within reach at all times, provide assistance with transfer out of bed and ambulation.  

## 2023-04-07 ENCOUNTER — Ambulatory Visit: Payer: No Typology Code available for payment source | Admitting: Neurology

## 2023-04-07 DIAGNOSIS — R0601 Orthopnea: Secondary | ICD-10-CM

## 2023-04-07 DIAGNOSIS — Z0289 Encounter for other administrative examinations: Secondary | ICD-10-CM

## 2023-04-07 DIAGNOSIS — Z9981 Dependence on supplemental oxygen: Secondary | ICD-10-CM

## 2023-04-07 DIAGNOSIS — G4733 Obstructive sleep apnea (adult) (pediatric): Secondary | ICD-10-CM | POA: Diagnosis not present

## 2023-04-07 DIAGNOSIS — Z85118 Personal history of other malignant neoplasm of bronchus and lung: Secondary | ICD-10-CM

## 2023-04-12 ENCOUNTER — Telehealth: Payer: Self-pay

## 2023-04-12 NOTE — Telephone Encounter (Signed)
Patient called regarding questions about mailout home sleep study. Patient said he discussed wearing CPAP machine but not oxygen in his consultation appointment with Dr. Vickey Huger and he wanted to confirm this was okay. I let the patient know to my knowledge we do not have patient wear a CPAP machine during the sleep study and that it would be okay for one night for him to not wear the machine. He was adamant this was not what was discussed in his visit and requested that Dr. Vickey Huger please call him to discuss what he is to do. I let him know that I could place a note to her to team but that it may be tomorrow before someone is able to call him as it was after 4pm today. The patient was fine with receiving a call back tomorrow but insistent that either Dr. Vickey Huger or a nurse from Dr. Oliva Bustard team call and discuss with him what he should do for this sleep study.   Please call patient back at [phone]. Patient was mailed disposable HST device last Wednesday to use. Thank you!

## 2023-04-12 NOTE — Telephone Encounter (Signed)
Per Dr Dohmeier's notes "Patient did not bring machine or attachments here.  Uses an 02 concentrator at home at 2 iters being bled into PAP>  He doesn't know the settings of his  PAP machine . Unsure if it is CPAP or BiPAP. Unable to sleep outside his power recliner , cannot be tested in lab .  Age of current device 2021 , after phillips recall.   1) Goal was to obtain HST for one night while not using  his PAP or 02 . He doubts being able to sleep not using PAP.  Will change to a HST while using PAP  but not 02 the same night-  And we need his machines downloads.   Unable to sleep outside his power recliner , cannot be tested in lab .    2) history of COPD related to smoking and Lung Cancer.  Must have had hypoxia to justify original 02 concentrator use - this was through the [place] ."  I did confirm with Dr Vickey Huger that she would like to see if the patient can start the HST without his cpap and oxygen concentrator. If he can make it for at least 2 hours without his machine that could potentially give [place] a little baseline data for the sleep study. If he finds has to put cpap on, then we would just ask that he notes when he places the cpap on. He should not add the oxygen to the cpap when he places CPAP on. We will then monitor the HST without oxygen being bled into it to see if CPAP effectively treats his oxygen or if we need to repeat titration study adding oxygen.

## 2023-04-13 NOTE — Telephone Encounter (Signed)
Called the patient and advised that Dr Vickey Huger and I discussed and after reviewing her notes the main purpose in his case if to test if the patient has oxygen level concerns while using his cpap. Dr. Vickey Huger does want to get a baseline if possible even if for a couple of hours. Advised the patient if he does put the cpap on, if he could pay attention to what time and let us know when he does put it on, this will allow [place] to note that cpap was on at a certain time. I did reassure that he should not bleed his oxygen into the cpap that way we could monitor his oxygen levels while on the CPAP. Pt verbalized understanding of this.

## 2023-04-22 NOTE — Progress Notes (Signed)
[facility] [name] Male, 62 y.o., 1960-06-26 MRN: [id number]   HOME SLEEP TEST REPORT ( by Watch PAT)  MAIL ORDER TEST STUDY DATA: 04-22-2023    ORDERING CLINICIAN: Melvyn Novas, MD  [facility]    CLINICAL INFORMATION/HISTORY: Jerome [name] is a 62 y.o. male patient in an electric scooter, who has been wheelchair dependent for 8 years .he is seen upon referral on 03/15/2023 from [place]  for a sleep consultation. The last sleep test was about 10 years ago and ended with a new CPAP machine order and oxygen order at the time.  He moved from [facility] to [place] in 2014.    Chief concern according to patient :  "The Jerome [name] questions my oxygen order from almost 10 years ago now ".    The patient had the first sleep study through the [facility], He reports having received a new machine in 2021 when [employer]'s devices were recalled. He had a Jerome [name] RV by telephone with the Jerome [name] only in June 2024_  BIPAP user , 100 % compliant. By time : 9 h 44 minutes . Max 14/ min 10 cm water , EEP of 3 cm BiPAP Aircurve V auto resmed  AHI residual 0.8/ h !  On oxygen.  Sleep relevant medical history: sleeps in a power assisted recliner, has no ability to sleep in a regular bed.  Cancer dx and radiation for lung cancer 2023, was much sleepier after that.  Active smoker, widowed, depending on home health aid. DM poorly controlled. COPD, clotting disorder.  COPD bronchial cancer right lung, radiation , chronic pain,  morbid obesity, poorly controlled DM,  secondary erythrocythemia. Family History :  brothers on CPAP with OSA,     Epworth sleepiness score:  8/ 24 points / FSS not endorsed / edentulous    BMI: 40.6 kg/m   Neck Circumference: 19''    FINDINGS:   Sleep Summary: The patient assured me that he is BiPAP dependent and we agreed that he will try to go to sleep without PAP but if he is unable to he will complete the study with PAP but without additional oxygen.  This way the study  would still give [place] the answer if he needs oxygen while on PAP therapy.   Total Recording Time (hours, min):      9 hours 47 minutes   Total Sleep Time (hours, min):    8 hours 12 minutes             Percent REM (%):   17.8%                                     Respiratory Indices:   Calculated pAHI (per hour):    14.3/h . The patient did not enter and fragmented sleep until about midnight when he started to use his positive airway pressure therapy again and was able to sleep uninterrupted from about 0.45 a.m. through 5:45 AM.                     REM pAHI:    22.7/h   by AASM criteria                                          NREM  pAHI:    12.5/h    by AASM criteria                      Supine AHI:      The total study was completed by the patient slept in his reclining power chair.                                             Oxygen Saturation Statistics:     O2 Saturation Range (%):   Between a nadir at 83% and the maximum saturation of 96% with a mean saturation of 91%.                                    O2 Saturation (minutes) <89%:    16.6 minutes or 3.4% of total sleep time.       Pulse Rate Statistics:   Pulse Mean (bpm):    81 bpm             Pulse Range:    Between 58 and 102 bpm             IMPRESSION:  This HST confirms the presence of obstructive sleep apnea when the patient attempted to sleep without positive airway pressure.  Once PAP therapy was applied, sleep became less fragmented and there was no evidence of persistent apneas and hypopneas. The patient did not suffer from sleep hypoxemia while treated with positive airway pressure therapy and not being on oxygen.   RECOMMENDATION: Jerome [name] requires obviously positive airway pressure therapy but he is not dependent on oxygen supplementation.  Sleep on biPAP was less fragmented with almost no residual apnea hypopnea index, but did not show hypoxemia. His bipap machine is less than 35 years old and will not need  replacement. Oxygen can be d/c.     INTERPRETING PHYSICIAN:   Jerome Novas, MD  [facility] Sleep Board certified by The [employer] of Sleep Medicine and Diplomate of the [employer] of Sleep Medicine. Board certified In Neurology through the ABPN, Fellow of the [employer] of Neu[employer].

## 2023-04-25 ENCOUNTER — Telehealth: Payer: Self-pay | Admitting: Neurology

## 2023-04-25 NOTE — Telephone Encounter (Signed)
[facility] [facility] [name] Male, 62 y.o., 1960-06-26 MRN: [id number]   HOME SLEEP TEST REPORT ( by Watch PAT)  MAIL ORDER TEST STUDY DATA: 04-22-2023    ORDERING CLINICIAN: Melvyn Novas, MD  REFERRING CLINICIAN: [facility]    CLINICAL INFORMATION/HISTORY: Jerome [name] is a 62 y.o. male patient in an electric scooter, who has been wheelchair dependent for 8 years .he is seen upon referral on 03/15/2023 from [place]  for a sleep consultation. The last sleep test was about 10 years ago and ended with a new CPAP machine order and oxygen order at the time.  He moved from [facility] to [place] in 2014.    Chief concern according to patient :  "The [facility] questions my oxygen order from almost 10 years ago now ".    The patient had the first sleep study through the [facility], He reports having received a new machine in 2021 when Respironic's devices were recalled. He had a VA RV by telephone with the VA only in June 2024_  BIPAP user , 100 % compliant. By time : 9 h 44 minutes . Max 14/ min 10 cm water , EEP of 3 cm BiPAP Aircurve V auto resmed  AHI residual 0.8/ h !  On oxygen.  Sleep relevant medical history: sleeps in a power assisted recliner, has no ability to sleep in a regular bed.  Cancer dx and radiation for lung cancer 2023, was much sleepier after that.  Active smoker, widowed, depending on home health aid. DM poorly controlled. COPD, clotting disorder.  COPD bronchial cancer right lung, radiation , chronic pain,  morbid obesity, poorly controlled DM,  secondary erythrocythemia. Family History :  brothers on CPAP with OSA,     Epworth sleepiness score:  8/ 24 points / FSS not endorsed / edentulous    BMI: 40.6 kg/m   Neck Circumference: 19''    FINDINGS:   Sleep Summary: The patient assured me that he is BiPAP dependent and we agreed that he will try to go to sleep without PAP but if he is unable to he will complete the study with PAP but without additional oxygen.  This way the study  would still give [place] the answer if he needs oxygen while on PAP therapy.   Total Recording Time (hours, min):      9 hours 47 minutes   Total Sleep Time (hours, min):    8 hours 12 minutes             Percent REM (%):   17.8%                                     Respiratory Indices:   Calculated pAHI (per hour):    14.3/h . The patient did not enter and fragmented sleep until about midnight when he started to use his positive airway pressure therapy again and was able to sleep uninterrupted from about 0.45 a.m. through 5:45 AM.                     REM pAHI:    22.7/h   by AASM criteria                                          NREM  pAHI:    12.5/h    by AASM criteria                      Supine AHI:      The total study was completed by the patient slept in his reclining power chair.                                             Oxygen Saturation Statistics:     O2 Saturation Range (%):   Between a nadir at 83% and the maximum saturation of 96% with a mean saturation of 91%.                                    O2 Saturation (minutes) <89%:    16.6 minutes or 3.4% of total sleep time.       Pulse Rate Statistics:   Pulse Mean (bpm):    81 bpm             Pulse Range:    Between 58 and 102 bpm             IMPRESSION:  This HST confirms the presence of obstructive sleep apnea when the patient attempted to sleep without positive airway pressure.  Once PAP therapy was applied, sleep became less fragmented and there was no evidence of persistent apneas and hypopneas. The patient did not suffer from sleep hypoxemia while treated with positive airway pressure therapy and not being on oxygen.   RECOMMENDATION: [name] requires obviously positive airway pressure therapy but he is not dependent on oxygen supplementation.  Sleep on biPAP was less fragmented with almost no residual apnea hypopnea index, but did not show hypoxemia. His bipap machine is less than 35 years old and will not need  replacement. Oxygen can be d/c.     INTERPRETING PHYSICIAN:   Melvyn Novas, MD  [facility] and [facility] Sleep Board certified by The [employer] of Sleep Medicine and Diplomate of the [employer] of Sleep Medicine. Board certified In Neurology through the ABPN, Fellow of the [employer] of Neurology.

## 2023-04-25 NOTE — Procedures (Signed)
[facility]   HOME SLEEP TEST REPORT ( by Watch PAT)  MAIL ORDER TEST STUDY DATE: 04-22-2023 DOB:   MRN:    ORDERING CLINICIAN:  REFERRING CLINICIAN:    CLINICAL INFORMATION/HISTORY: Jerome [name] is a 62 y.o. male patient in an electric scooter, who was wheelchair dependent for 8 years now is seen upon referral on 03/15/2023 from [place]  for a sleep consultation. The last sleep was about 10 years ago and ended with a new CPAP and oxygen at the time.  He moved from [facility] to [place] in 2014.    Chief concern according to patient :  "The [facility] questions my oxygen order from almost 10 years ago now ".    The patient had the first sleep study through the [facility], He reports having received a new machine in 2021 when Resp[employer]ic's devices were recalled. He had a VA RV by telephone with the VA only in June 2024_  BIPAP user , 100 % compliant. By time : 9 h 44 minutes . Max 14/ min 10 cm water , EEP of 3 cm BiPAP Aircurve V auto resmed  AHI residual 0.8/ h !  On oxygen.  Sleep relevant medical history: sleeps in a power assisted recliner, has no ability to sleep in a regular bed.  Cancer dx and radiation for lung cancer 2023, was much sleepier after that.  Active smoker, widowed, depending on home health aid. DM poorly controlled. COPD, clotting disorder.  COPD bronchial cancer right lung, radiation , chronic pain,  morbid obesity, poorly controlled DM,  secondary erythrocythemia. Family History :  brothers on CPAP with OSA,     Epworth sleepiness score:  8/ 24 points / FSS not endorsed / edentulous    BMI: 40.6 kg/m   Neck Circumference: 19''    FINDINGS:   Sleep Summary: The patient assured me that he is BiPAP dependent and we agreed that he will try to go to sleep without PAP but if he is unable to he will complete the study with PAP but without additional oxygen.  This way the study would still give [place] the answer if he needs oxygen while on PAP therapy.   Total Recording Time  (hours, min):      9 hours 47 minutes   Total Sleep Time (hours, min):    8 hours 12 minutes             Percent REM (%):   17.8%                                     Respiratory Indices:   Calculated pAHI (per hour):    14.3/h . The patient did not enter and fragmented sleep until about midnight when he started to use his positive airway pressure therapy again and was able to sleep uninterrupted from about 0.45 a.m. through 5:45 AM.                     REM pAHI:    22.7/h   by AASM criteria                                          NREM pAHI:    12.5/h    by AASM criteria  Supine AHI:      The total study was completed by the patient slept in his reclining power chair.                                             Oxygen Saturation Statistics:     O2 Saturation Range (%):   Between a nadir at 83% and the maximum saturation of 96% with a mean saturation of 91%.                                    O2 Saturation (minutes) <89%:    16.6 minutes or 3.4% of total sleep time.       Pulse Rate Statistics:   Pulse Mean (bpm):    81 bpm             Pulse Range:    Between 58 and 102 bpm             IMPRESSION:  This HST confirms the presence of obstructive sleep apnea when the patient attempted to sleep without positive airway pressure.  Once PAP therapy was applied, sleep became less fragmented and there was no evidence of persistent apneas and hypopneas. The patient did not suffer from sleep hypoxemia while treated with positive airway pressure therapy and not being on oxygen.   RECOMMENDATION: [name] requires obviously positive airway pressure therapy but he is not dependent on oxygen supplementation.  Sleep on biPAP was less fragmented with almost no residual apnea hypopnea index, but did not show hypoxemia. His bipap machine is less than 59 years old and will not need replacement. Oxygen can be d/c.     INTERPRETING PHYSICIAN:   Melvyn Novas, MD  [facility] and [facility] Sleep Board certified by The [employer] of Sleep Medicine and Diplomate of the [employer] of Sleep Medicine. Board certified In Neurology through the ABPN, Fellow of the [employer] of Neurology.

## 2023-04-27 NOTE — Telephone Encounter (Signed)
Pt called stating that he has been waiting two weeks and he does not understand why no one has called him with the results. I informed the pt that a message has been forwarded to the provider and someone will be in touch with him once the results have been looked over. Please advise.

## 2023-04-27 NOTE — Telephone Encounter (Signed)
Called and spoke with the patient about the SSR. Advised based off this report there was no need for oxygen. I have printed and faxed this sleep study to [facility] and received confirmation  "[facility] patient referred through [place] [facility] for sleep evaluation- Q? need for oxygen while on BiPAP.  The HST showed that the patient is BiPAP dependent but not in need of additional oxygen. He can use his device , but won't need oxygen anymore. The PAP machine is less than 28 years old.   Please send report to his VA provider ( the name is not in EPIC) . The patient is not on mychart and requested a phone call "

## 2023-05-05 ENCOUNTER — Ambulatory Visit
Payer: Medicare PPO | Attending: Student in an Organized Health Care Education/Training Program | Admitting: Student in an Organized Health Care Education/Training Program

## 2023-05-05 DIAGNOSIS — E1140 Type 2 diabetes mellitus with diabetic neuropathy, unspecified: Secondary | ICD-10-CM

## 2023-05-05 DIAGNOSIS — M47816 Spondylosis without myelopathy or radiculopathy, lumbar region: Secondary | ICD-10-CM | POA: Diagnosis not present

## 2023-05-05 DIAGNOSIS — Z794 Long term (current) use of insulin: Secondary | ICD-10-CM

## 2023-05-05 NOTE — Progress Notes (Addendum)
Patient: Jerome [name]  Service Category: E/M  Provider: Edward Jolly, MD  DOB: 07-13-1960  DOS: 05/05/2023  Location: Office  MRN: [id number]  Setting: Ambulatory outpatient  Referring Provider: [facility]  Type: Established Patient  Specialty: Interventional Pain Management  PCP: [facility]  Location: Remote location  Delivery: TeleHealth     Virtual Encounter - Pain Management PROVIDER NOTE: Information contained herein reflects review and annotations entered in association with encounter. Interpretation of such information and data should be left to medically-trained personnel. Information provided to patient can be located elsewhere in the medical record under "Patient Instructions". Document created using STT-dictation technology, any transcriptional errors that may result from process are unintentional.    Contact & Pharmacy Preferred: [phone] Home: [phone] (home) Mobile: [phone] (mobile) E-mail: No e-mail address on record  [facility] [place], [place] - [address] [place] [address] Phone: [phone] Fax: [phone]   Pre-screening  Jerome [name] offered "in-person" vs "virtual" encounter. He indicated preferring virtual for this encounter.   Reason COVID-19*  Social distancing based on CDC and AMA recommendations.   I contacted Jerome [name] on 05/05/2023 via telephone.      I clearly identified myself as Edward Jolly, MD. I verified that I was speaking with the correct person using two identifiers (Name: Jerome [name], and date of birth: 09/22/1960).  Consent I sought verbal advanced consent from Jerome [name] for virtual visit interactions. I informed Jerome [name] of possible security and privacy concerns, risks, and limitations associated with providing "not-in-person" medical evaluation and management services. I also informed Jerome [name] of the availability of "in-person" appointments. Finally, I  informed him that there would be a charge for the virtual visit and that he could be  personally, fully or partially, financially responsible for it. Jerome [name] expressed understanding and agreed to proceed.   Historic Elements   Jerome [name] is a 62 y.o. year old, male patient evaluated today after our last contact on 03/30/2023. Jerome [name]  has a past medical history of Arthritis, Cancer (HCC), Clotting disorder (HCC), COPD (chronic obstructive pulmonary disease) (HCC), Diabetes mellitus without complication (HCC), Hyperlipidemia, Hypertension, Oxygen deficiency, and Sleep apnea. He also  has a past surgical history that includes Knee surgery (Left). Jerome [name] has a current medication list which includes the following prescription(s): acetaminophen, albuterol, aspirin ec, atorvastatin, clopidogrel, colchicine, empagliflozin-metformin hcl, famotidine, gabapentin, glipizide, insulin glargine, lidocaine, lisinopril, omeprazole, prochlorperazine, senna, tiotropium bromide-olodaterol, varenicline, and zolpidem. He  reports that he has been smoking cigarettes. He has never used smokeless tobacco. He reports that he does not drink alcohol and does not use drugs. Jerome [name] has No Known Allergies.  BMI: Estimated body mass index is 43.07 kg/m as calculated from the following:   Height as of 03/30/23: 5\' 7"  (1.702 m).   Weight as of 03/30/23: 275 lb (124.7 kg). Last encounter: 03/30/2023. Last procedure: 02/24/2023.  HPI  Today, he is being contacted for a post-procedure assessment.   Post-procedure evaluation   Type: Qutenza Neurolysis #1  Laterality:  Bilateral Area treated: Feet Imaging Guidance: None Anesthesia/analgesia/anxiolysis/sedation: None required Medication (Right): Qutenza (capsaicin 8%) topical system Medication (Left): Qutenza (capsaicin 8%) topical system Date: 02/24/2023 Performed by: Edward Jolly, MD Rationale (medical necessity): procedure needed and proper for the  treatment of Jerome [name] medical symptoms and needs. Indication: Painful diabetic peripheral neuralgia (DPN) (ICD-10-CM:E11.40) severe enough to impact quality of life or function. 1. Chronic  painful diabetic neuropathy (HCC)    NAS-11 Pain score:   Pre-procedure: 3 /10   Post-procedure: 3 /10      Effectiveness:  Initial hour after procedure: 0 %  Subsequent 4-6 hours post-procedure: 0 %  Analgesia past initial 6 hours: 50 %  Ongoing improvement:  Analgesic:  50% Function: Somewhat improved ROM: Somewhat improved     Laboratory Chemistry Profile   Renal Lab Results  Component Value Date   BUN 15 10/04/2020   CREATININE 0.56 (L) 10/04/2020   GFRAA >60 11/16/2016   GFRNONAA >60 10/04/2020    Hepatic Lab Results  Component Value Date   AST 16 10/04/2020   ALT 18 10/04/2020   ALBUMIN 3.4 (L) 10/04/2020   ALKPHOS 69 10/04/2020   LIPASE 25 10/04/2020    Electrolytes Lab Results  Component Value Date   NA 135 10/04/2020   K 4.2 10/04/2020   CL 101 10/04/2020   CALCIUM 9.3 10/04/2020    Bone No results found for: "VD25OH", "VD125OH2TOT", "ZO1096EA5", "WU9811BJ4", "25OHVITD1", "25OHVITD2", "25OHVITD3", "TESTOFREE", "TESTOSTERONE"  Inflammation (CRP: Acute Phase) (ESR: Chronic Phase) No results found for: "CRP", "ESRSEDRATE", "LATICACIDVEN"       Note: Above Lab results reviewed.  Imaging  Home sleep test Dohmeier, Porfirio Mylar, MD     04/25/2023  8:55 PM      [facility]   HOME SLEEP TEST REPORT ( by Watch PAT)  MAIL ORDER TEST STUDY DATE: 04-22-2023 DOB:   MRN:    ORDERING CLINICIAN:  REFERRING CLINICIAN:    CLINICAL INFORMATION/HISTORY: Jerome [name] is a 62 y.o. male  patient in an electric scooter, who was wheelchair dependent for  8 years now is seen upon referral on 03/15/2023 from [place]  for a  sleep consultation. The last sleep was about 10 years ago and  ended with a new CPAP and oxygen at the time.  He moved from [facility] to [place] in 2014.     Chief concern according to patient :  "The [facility] questions my oxygen  order from almost 10 years ago now ".    The patient had the first sleep study through the [facility], He  reports having received a new machine in 2021 when Resp[employer]'s  devices were recalled. He had a VA RV by telephone with the VA only in June 2024_  BIPAP user , 100 % compliant. By time : 9 h 44 minutes . Max 14/  min 10 cm water , EEP of 3 cm BiPAP Aircurve V auto resmed  AHI  residual 0.8/ h !  On oxygen.  Sleep relevant medical history: sleeps in a power assisted  recliner, has no ability to sleep in a regular bed.  Cancer dx and radiation for lung cancer 2023, was much sleepier  after that.  Active smoker, widowed, depending on home health aid. DM poorly  controlled. COPD, clotting disorder.  COPD bronchial cancer right lung, radiation , chronic pain,   morbid obesity, poorly controlled DM,  secondary erythrocythemia.  Family History :  brothers on CPAP with OSA,     Epworth sleepiness score:  8/ 24 points / FSS not endorsed /  edentulous    BMI: 40.6 kg/m   Neck Circumference: 19''    FINDINGS:   Sleep Summary: The patient assured me that he is BiPAP dependent  and we agreed that he will try to go to sleep without PAP but if  he is unable to he will complete the study with PAP  but without  additional oxygen.  This way the study would still give [place] the  answer if he needs oxygen while on PAP therapy.   Total Recording Time (hours, min):      9 hours 47 minutes   Total Sleep Time (hours, min):    8 hours 12 minutes             Percent REM (%):   17.8%                                     Respiratory Indices:   Calculated pAHI (per hour):    14.3/h . The patient did not enter and fragmented sleep until about  midnight when he started to use his positive airway pressure  therapy again and was able to sleep uninterrupted from about 0.45  a.m. through 5:45 AM.                     REM pAHI:     22.7/h   by AASM criteria                                           NREM pAHI:    12.5/h    by AASM criteria                      Supine AHI:      The total study was completed by the patient  slept in his reclining power chair.                                              Oxygen Saturation Statistics:     O2 Saturation Range (%):   Between a nadir at 83% and the maximum  saturation of 96% with a mean saturation of 91%.                                     O2 Saturation (minutes) <89%:    16.6 minutes or 3.4% of total  sleep time.       Pulse Rate Statistics:   Pulse Mean (bpm):    81 bpm             Pulse Range:    Between 58 and 102 bpm             IMPRESSION:  This HST confirms the presence of obstructive sleep  apnea when the patient attempted to sleep without positive airway  pressure.  Once PAP therapy was applied, sleep became less  fragmented and there was no evidence of persistent apneas and  hypopneas. The patient did not suffer from sleep hypoxemia while treated  with positive airway pressure therapy and not being on oxygen.   RECOMMENDATION: Mr. Dimarco requires obviously positive airway  pressure therapy but he is not dependent on oxygen  supplementation.  Sleep on biPAP was less fragmented with almost  no residual apnea hypopnea index, but did not show hypoxemia. His bipap machine is less than 100 years old and will not need  replacement. Oxygen can be d/c.  INTERPRETING PHYSICIAN:   Melvyn Novas, MD  [facility] and [facility] Sleep Board certified by The [employer] of Sleep Medicine and  Diplomate of the [employer] of Sleep Medicine. Board certified In Neurology through the ABPN, Fellow of the  [employer] of Neu[employer].      Assessment  The primary encounter diagnosis was Lumbar spondylosis. A diagnosis of Chronic painful diabetic neuropathy (HCC) was also pertinent to this visit.  Plan of Care  Repeat QUTENZA  PRN  Follow-up plan:   Return for patient will call to schedule F2F appt prn.      Recent Visits Date Type Provider Dept  03/30/23 Office Visit Edward Jolly, MD [facility]  02/24/23 Procedure visit Edward Jolly, MD [facility]  02/04/23 Office Visit Edward Jolly, MD [facility]  Showing recent visits within past 90 days and meeting all other requirements Today's Visits Date Type Provider Dept  05/05/23 Office Visit Edward Jolly, MD [facility]  Showing today's visits and meeting all other requirements Future Appointments No visits were found meeting these conditions. Showing future appointments within next 90 days and meeting all other requirements  I discussed the assessment and treatment plan with the patient. The patient was provided an opportunity to ask questions and all were answered. The patient agreed with the plan and demonstrated an understanding of the instructions.  Patient advised to call back or seek an in-person evaluation if the symptoms or condition worsens.  Duration of encounter: .  Note by: Edward Jolly, MD Date: 05/05/2023; Time: 3:48 PM

## 2023-05-05 NOTE — Addendum Note (Signed)
Addended by: Edward Jolly on: 05/05/2023 03:48 PM   Modules accepted: Level of Service

## 2023-05-21 ENCOUNTER — Telehealth: Payer: Self-pay | Admitting: Student in an Organized Health Care Education/Training Program

## 2023-05-21 NOTE — Telephone Encounter (Signed)
PT called stated that he will be going out of town on 06-12-23 for the holidays. Wanted to know if he can get Oxycodone fill so he will have it. Please give patient a call. TY

## 2023-05-21 NOTE — Telephone Encounter (Signed)
Patient requesting a refill on medication before his trip out of town. Informed him that he would need to make a MM appointment with Dr Cherylann Ratel to have this done. Patient with understanding  . Transferred to front staff

## 2023-05-25 ENCOUNTER — Encounter: Payer: Self-pay | Admitting: Student in an Organized Health Care Education/Training Program

## 2023-05-25 ENCOUNTER — Ambulatory Visit
Payer: No Typology Code available for payment source | Attending: Student in an Organized Health Care Education/Training Program | Admitting: Student in an Organized Health Care Education/Training Program

## 2023-05-25 VITALS — BP 134/46 | HR 97 | Temp 97.9°F | Resp 18 | Ht 67.0 in | Wt 262.0 lb

## 2023-05-25 DIAGNOSIS — G4700 Insomnia, unspecified: Secondary | ICD-10-CM | POA: Diagnosis not present

## 2023-05-25 DIAGNOSIS — Z79899 Other long term (current) drug therapy: Secondary | ICD-10-CM | POA: Diagnosis not present

## 2023-05-25 DIAGNOSIS — G894 Chronic pain syndrome: Secondary | ICD-10-CM | POA: Diagnosis present

## 2023-05-25 DIAGNOSIS — G8929 Other chronic pain: Secondary | ICD-10-CM

## 2023-05-25 DIAGNOSIS — M25511 Pain in right shoulder: Secondary | ICD-10-CM | POA: Insufficient documentation

## 2023-05-25 DIAGNOSIS — Z7984 Long term (current) use of oral hypoglycemic drugs: Secondary | ICD-10-CM | POA: Diagnosis not present

## 2023-05-25 DIAGNOSIS — E1140 Type 2 diabetes mellitus with diabetic neuropathy, unspecified: Secondary | ICD-10-CM | POA: Insufficient documentation

## 2023-05-25 DIAGNOSIS — M19011 Primary osteoarthritis, right shoulder: Secondary | ICD-10-CM | POA: Insufficient documentation

## 2023-05-25 DIAGNOSIS — G4733 Obstructive sleep apnea (adult) (pediatric): Secondary | ICD-10-CM | POA: Diagnosis not present

## 2023-05-25 DIAGNOSIS — M47816 Spondylosis without myelopathy or radiculopathy, lumbar region: Secondary | ICD-10-CM | POA: Insufficient documentation

## 2023-05-25 MED ORDER — OXYCODONE-ACETAMINOPHEN 5-325 MG PO TABS: 1.0000 | ORAL_TABLET | Freq: Every day | ORAL | 0 refills | Status: AC | PRN

## 2023-05-25 NOTE — Progress Notes (Signed)
PROVIDER NOTE: Information contained herein reflects review and annotations entered in association with encounter. Interpretation of such information and data should be left to medically-trained personnel. Information provided to patient can be located elsewhere in the medical record under "Patient Instructions". Document created using STT-dictation technology, any transcriptional errors that may result from process are unintentional.    Patient: Jerome [name]  Service Category: E/M  Provider: Edward Jolly, MD  DOB: [birth year]  DOS: 05/25/2023  Referring Provider: Center, [facility]  MRN: [id number]  Specialty: Interventional Pain Management  PCP: [facility]  Type: Established Patient  Setting: Ambulatory outpatient    Location: Office  Delivery: Face-to-face     HPI  [name], a 62 y.o. year old male, is here today because of his Lumbar spondylosis [M47.816]. Mr. Dechristopher primary complain today is Back Pain  Pertinent problems: Mr. Schopp has Chronic pain syndrome; Lumbar spondylosis; Chronic right shoulder pain; Primary osteoarthritis of right shoulder; Pain management contract signed; and Chronic painful diabetic neuropathy (HCC) on their pertinent problem list. Pain Assessment: Severity of Chronic pain is reported as a 4 /10. Location: Back Lower/denies. Onset: More than a month ago. Quality: Constant, Aching, Squeezing, Throbbing. Timing: Constant. Modifying factor(s): pain medication. Vitals:  height is 5\' 7"  (1.702 m) and weight is 262 lb (118.8 kg). His temporal temperature is 97.9 F (36.6 C). His blood pressure is 134/46 (abnormal) and his pulse is 97. His respiration is 18 and oxygen saturation is 96%.  BMI: Estimated body mass index is 41.04 kg/m as calculated from the following:   Height as of this encounter: 5\' 7"  (1.702 m).   Weight as of this encounter: 262 lb (118.8 kg). Last encounter: 05/05/2023. Last procedure: 02/24/2023.  Reason for  encounter:   History of Present Illness   The patient, with a history of chronic back pain, presented for a medication refill. He reported that he was given a 30-day supply of medication, but his visits to the clinic often exceed this 30-day period. At the time of the visit, he had approximately eight days' worth of medication left. He expressed concern about an upcoming 12-hour drive to [place], unsure of how his back would react to the long journey. He was anxious about potentially running out of medication during his trip, which was expected to last two weeks. The patient also mentioned having a sleep aid, which he was worried about running out of during his trip. He had previously arranged for his medication to be sent to the [facility] pharmacy and mailed to him, a process that typically takes around five days.       Pharmacotherapy Assessment  Analgesic: Oxycodone daily 5 mg prn   Monitoring: [place] PMP: PDMP reviewed during this encounter.       Pharmacotherapy: No side-effects or adverse reactions reported. Compliance: No problems identified. Effectiveness: Clinically acceptable.  Earlyne Iba, RN  05/25/2023 10:11 AM  Sign when Signing Visit Nursing Pain Medication Assessment:  Safety precautions to be maintained throughout the outpatient stay will include: orient to surroundings, keep bed in low position, maintain call bell within reach at all times, provide assistance with transfer out of bed and ambulation.   Nursing Pain Medication Assessment:  Safety precautions to be maintained throughout the outpatient stay will include: orient to surroundings, keep bed in low position, maintain call bell within reach at all times, provide assistance with transfer out of bed and ambulation.  Medication Inspection Compliance: Mr. Lessman did not comply  with our request to bring his pills to be counted. He was reminded that bringing the medication bottles, even when empty, is a requirement.  Medication:  None brought in. Pill/Patch Count: None available to be counted. Bottle Appearance: No container available. Did not bring bottle(s) to appointment. Filled Date: N/A Last Medication intake:  Day before yesterday     No results found for: "CBDTHCR" No results found for: "D8THCCBX" No results found for: "D9THCCBX"  UDS:  Summary  Date Value Ref Range Status  01/19/2023 Note  Final    Comment:    ==================================================================== Compliance Drug Analysis, Ur ==================================================================== Test                             Result       Flag       Units  Drug Present and Declared for Prescription Verification   Gabapentin                     PRESENT      EXPECTED   Zolpidem Acid                  PRESENT      EXPECTED    Zolpidem acid is an expected metabolite of zolpidem.  Drug Absent but Declared for Prescription Verification   Salicylate                     Not Detected UNEXPECTED    Aspirin, as indicated in the declared medication list, is not always    detected even when used as directed.  ==================================================================== Test                      Result    Flag   Units      Ref Range   Creatinine              44               mg/dL      >=45 ==================================================================== Declared Medications:  The flagging and interpretation on this report are based on the  following declared medications.  Unexpected results may arise from  inaccuracies in the declared medications.   **Note: The testing scope of this panel includes these medications:   Gabapentin (Neurontin)   **Note: The testing scope of this panel does not include small to  moderate amounts of these reported medications:   Aspirin  Zolpidem (Ambien)   **Note: The testing scope of this panel does not include the  following reported medications:   Albuterol   Atorvastatin  Clopidogrel (Plavix)  Empagliflozin  Glipizide (Glucotrol)  Insulin (Lantus)  Lisinopril (Zestril)  Metformin  Omeprazole (Prilosec)  Sennosides (Senokot) ==================================================================== For clinical consultation, please call [phone]. ====================================================================       ROS  Constitutional: Denies any fever or chills Gastrointestinal: No reported hemesis, hematochezia, vomiting, or acute GI distress Musculoskeletal:  low back pain Neurological: No reported episodes of acute onset apraxia, aphasia, dysarthria, agnosia, amnesia, paralysis, loss of coordination, or loss of consciousness  Medication Review  Empagliflozin-metFORMIN HCl, Tiotropium Bromide-Olodaterol, acetaminophen, albuterol, aspirin EC, atorvastatin, clopidogrel, colchicine, famotidine, gabapentin, glipiZIDE, insulin glargine, lidocaine, lisinopril, omeprazole, oxyCODONE-acetaminophen, prochlorperazine, senna, varenicline, and zolpidem  History Review  Allergy: Mr. Kaemmerer has No Known Allergies. Drug: Mr. Teems  reports no history of drug use. Alcohol:  reports no history of alcohol use. Tobacco:  reports that he  has been smoking cigarettes. He has never used smokeless tobacco. Social: Mr. Hecht  reports that he has been smoking cigarettes. He has never used smokeless tobacco. He reports that he does not drink alcohol and does not use drugs. Medical:  has a past medical history of Arthritis, Cancer (HCC), Clotting disorder (HCC), COPD (chronic obstructive pulmonary disease) (HCC), Diabetes mellitus without complication (HCC), Hyperlipidemia, Hypertension, Oxygen deficiency, and Sleep apnea. Surgical: Mr. Niedzwiecki  has a past surgical history that includes Knee surgery (Left). Family: family history is not on file.  Laboratory Chemistry Profile   Renal Lab Results  Component Value Date   BUN 15 10/04/2020   CREATININE  0.56 (L) 10/04/2020   GFRAA >60 11/16/2016   GFRNONAA >60 10/04/2020    Hepatic Lab Results  Component Value Date   AST 16 10/04/2020   ALT 18 10/04/2020   ALBUMIN 3.4 (L) 10/04/2020   ALKPHOS 69 10/04/2020   LIPASE 25 10/04/2020    Electrolytes Lab Results  Component Value Date   NA 135 10/04/2020   K 4.2 10/04/2020   CL 101 10/04/2020   CALCIUM 9.3 10/04/2020    Bone No results found for: "VD25OH", "VD125OH2TOT", "OZ3086VH8", "IO9629BM8", "25OHVITD1", "25OHVITD2", "25OHVITD3", "TESTOFREE", "TESTOSTERONE"  Inflammation (CRP: Acute Phase) (ESR: Chronic Phase) No results found for: "CRP", "ESRSEDRATE", "LATICACIDVEN"       Note: Above Lab results reviewed.  Recent Imaging Review  Home sleep test Dohmeier, Porfirio Mylar, MD     04/25/2023  8:55 PM      [facility]   HOME SLEEP TEST REPORT ( by Watch PAT)  MAIL ORDER TEST STUDY DATE: 04-22-2023 DOB:   MRN:    ORDERING CLINICIAN:  REFERRING CLINICIAN:    CLINICAL INFORMATION/HISTORY: TAKSH GRING is a 62 y.o. male  patient in an electric scooter, who was wheelchair dependent for  8 years now is seen upon referral on 03/15/2023 from [place]  for a  sleep consultation. The last sleep was about 10 years ago and  ended with a new CPAP and oxygen at the time.  He moved from [facility] to [place] in 2014.    Chief concern according to patient :  "[facility]  [facility] from almost 10 years ago now ".    The patient had the first sleep study through the [facility], He  reports having received a new machine in 2021 when Resp[employer]ic's  devices were recalled. He had a [facility] RV by telephone with the [facility] only in June 2024_  BIPAP user , 100 % compliant. By time : 9 h 44 minutes . Max 14/  min 10 cm water , EEP of 3 cm BiPAP Aircurve V auto resmed  AHI  residual 0.8/ h !  On oxygen.  Sleep relevant medical history: sleeps in a power assisted  recliner, has no ability to sleep in a regular bed.  Cancer dx and radiation for lung  cancer 2023, was much sleepier  after that.  Active smoker, widowed, depending on home health aid. DM poorly  controlled. COPD, clotting disorder.  COPD bronchial cancer right lung, radiation , chronic pain,   morbid obesity, poorly controlled DM,  secondary erythrocythemia.  Family History :  brothers on CPAP with OSA,     Epworth sleepiness score:  8/ 24 points / FSS not endorsed /  edentulous    BMI: 40.6 kg/m   Neck Circumference: 19''    FINDINGS:   Sleep Summary: The patient assured me that  he is BiPAP dependent  and we agreed that he will try to go to sleep without PAP but if  he is unable to he will complete the study with PAP but without  additional oxygen.  This way the study would still give [place] the  answer if he needs oxygen while on PAP therapy.   Total Recording Time (hours, min):      9 hours 47 minutes   Total Sleep Time (hours, min):    8 hours 12 minutes             Percent REM (%):   17.8%                                     Respiratory Indices:   Calculated pAHI (per hour):    14.3/h . The patient did not enter and fragmented sleep until about  midnight when he started to use his positive airway pressure  therapy again and was able to sleep uninterrupted from about 0.45  a.m. through 5:45 AM.                     REM pAHI:    22.7/h   by AASM criteria                                           NREM pAHI:    12.5/h    by AASM criteria                      Supine AHI:      The total study was completed by the patient  slept in his reclining power chair.                                              Oxygen Saturation Statistics:     O2 Saturation Range (%):   Between a nadir at 83% and the maximum  saturation of 96% with a mean saturation of 91%.                                     O2 Saturation (minutes) <89%:    16.6 minutes or 3.4% of total  sleep time.       Pulse Rate Statistics:   Pulse Mean (bpm):    81 bpm             Pulse Range:     Between 58 and 102 bpm             IMPRESSION:  This HST confirms the presence of obstructive sleep  apnea when the patient attempted to sleep without positive airway  pressure.  Once PAP therapy was applied, sleep became less  fragmented and there was no evidence of persistent apneas and  hypopneas. The patient did not suffer from sleep hypoxemia while treated  with positive airway pressure therapy and not being on oxygen.   RECOMMENDATION: Mr. Kolodziej requires obviously positive airway  pressure therapy but he is not dependent on oxygen  supplementation.  Sleep on biPAP was less fragmented with almost  no  residual apnea hypopnea index, but did not show hypoxemia. His bipap machine is less than 58 years old and will not need  replacement. Oxygen can be d/c.     INTERPRETING PHYSICIAN:   Melvyn Novas, MD  [facility] Sleep Board certified by The [employer] of Sleep Medicine and  Diplomate of the [employer] of Sleep Medicine. Board certified In Neurology through the ABPN, Fellow of the  [employer] of Neu[employer].     Note: Reviewed        Physical Exam  General appearance: Well nourished, well developed, and well hydrated. In no apparent acute distress Mental status: Alert, oriented x 3 (person, place, & time)       Respiratory: No evidence of acute respiratory distress Eyes: PERLA Vitals: BP (!) 134/46   Pulse 97   Temp 97.9 F (36.6 C) (Temporal)   Resp 18   Ht 5\' 7"  (1.702 m)   Wt 262 lb (118.8 kg)   SpO2 96%   BMI 41.04 kg/m  BMI: Estimated body mass index is 41.04 kg/m as calculated from the following:   Height as of this encounter: 5\' 7"  (1.702 m).   Weight as of this encounter: 262 lb (118.8 kg). Ideal: Ideal body weight: 66.1 kg (145 lb 11.6 oz) Adjusted ideal body weight: 87.2 kg (192 lb 3.7 oz)  Low back pain Patient presents in rollator Parasthesias bilateral feet  Assessment   Diagnosis Status  1.  Lumbar spondylosis   2. Chronic painful diabetic neuropathy (HCC)   3. Lumbar facet arthropathy   4. Chronic right shoulder pain   5. Primary osteoarthritis of right shoulder   6. Chronic pain syndrome    Controlled Controlled Controlled   Updated Problems: No problems updated.  Plan of Care  Problem-specific:  Assessment and Plan    Chronic Pain Management   He is managing chronic pain and requires regular medication refills. Currently, he is low on medication and planning a 12-hour drive to [place]. He expressed concern about managing pain during the trip and requested an early refill. We discussed the importance of bringing medications to appointments for accurate counts and acknowledged his difficulty in frequent clinic visits. We will send the prescription to the [facility] pharmacy for a refill and ensure the medication is mailed before the trip.  Insomnia   He requires a sleeping aid, with the next refill scheduled for the seventeenth. He is concerned about running out during the upcoming trip and requested an early refill. We will request an early refill for the sleeping aid from the [place] pharmacy.       Mr. SHAVONTE PORZIO has a current medication list which includes the following long-term medication(s): albuterol, atorvastatin, colchicine, famotidine, gabapentin, glipizide, insulin glargine, lisinopril, omeprazole, prochlorperazine, and zolpidem.  Pharmacotherapy (Medications Ordered): Meds ordered this encounter  Medications   oxyCODONE-acetaminophen (PERCOCET) 5-325 MG tablet    Sig: Take 1 tablet by mouth daily as needed for severe pain (pain score 7-10). Must last 30 days.    Dispense:  30 tablet    Refill:  0    Chronic Pain: STOP Act (Not applicable) Fill 1 day early if closed on refill date. Avoid benzodiazepines within 8 hours of opioids   Orders:  No orders of the defined types were placed in this encounter.  Follow-up plan:   Return for patient will call to  schedule F2F appt prn.      Recent Visits Date Type Provider Dept  05/05/23 Office Visit Edward Jolly, MD [facility]  03/30/23 Office Visit Edward Jolly, MD [facility]  02/24/23 Procedure visit Edward Jolly, MD [facility]  Showing recent visits within past 90 days and meeting all other requirements Today's Visits Date Type Provider Dept  05/25/23 Office Visit Edward Jolly, MD [facility]  Showing today's visits and meeting all other requirements Future Appointments No visits were found meeting these conditions. Showing future appointments within next 90 days and meeting all other requirements  I discussed the assessment and treatment plan with the patient. The patient was provided an opportunity to ask questions and all were answered. The patient agreed with the plan and demonstrated an understanding of the instructions.  Patient advised to call back or seek an in-person evaluation if the symptoms or condition worsens.  Duration of encounter: .  Total time on encounter, as per AMA guidelines included both the face-to-face and non-face-to-face time personally spent by the physician and/or other qualified health care professional(s) on the day of the encounter (includes time in activities that require the physician or other qualified health care professional and does not include time in activities normally performed by clinical staff). Physician's time may include the following activities when performed: Preparing to see the patient (e.g., pre-charting review of records, searching for previously ordered imaging, lab work, and nerve conduction tests) Review of prior analgesic pharmacotherapies. Reviewing PMP Interpreting ordered tests (e.g., lab work, imaging, nerve conduction tests) Performing post-procedure evaluations, including interpretation of diagnostic procedures Obtaining and/or reviewing separately obtained  history Performing a medically appropriate examination and/or evaluation Counseling and educating the patient/family/caregiver Ordering medications, tests, or procedures Referring and communicating with other health care professionals (when not separately reported) Documenting clinical information in the electronic or other health record Independently interpreting results (not separately reported) and communicating results to the patient/ family/caregiver Care coordination (not separately reported)  Note by: Edward Jolly, MD Date: 05/25/2023; Time: 10:43 AM

## 2023-05-25 NOTE — Progress Notes (Signed)
Nursing Pain Medication Assessment:  Safety precautions to be maintained throughout the outpatient stay will include: orient to surroundings, keep bed in low position, maintain call bell within reach at all times, provide assistance with transfer out of bed and ambulation.   Nursing Pain Medication Assessment:  Safety precautions to be maintained throughout the outpatient stay will include: orient to surroundings, keep bed in low position, maintain call bell within reach at all times, provide assistance with transfer out of bed and ambulation.  Medication Inspection Compliance: Jerome [name] did not comply with our request to bring his pills to be counted. He was reminded that bringing the medication bottles, even when empty, is a requirement.  Medication: None brought in. Pill/Patch Count: None available to be counted. Bottle Appearance: No container available. Did not bring bottle(s) to appointment. Filled Date: N/A Last Medication intake:  Day before yesterday

## 2023-05-25 NOTE — Patient Instructions (Signed)
 ______________________________________________________________________    Medication Rules  Purpose: To inform patients, and their family members, of our medication rules and regulations.  Applies to: All patients receiving prescriptions from our practice (written or electronic).  Pharmacy of record: This is the pharmacy where your electronic prescriptions will be sent. Make sure we have the correct one.  Electronic prescriptions: In compliance with the [facility] Strengthen Opioid Misuse Prevention (STOP) Act of 2017 (Session Conni Elliot [phone]), effective June 15, 2018, all controlled substances must be electronically prescribed. Written prescriptions, faxing, or calling prescriptions to a pharmacy will no longer be done.  Prescription refills: These will be provided only during in-person appointments. No medications will be renewed without a "face-to-face" evaluation with your provider. Applies to all prescriptions.  NOTE: The following applies primarily to controlled substances (Opioid* Pain Medications).   Type of encounter (visit): For patients receiving controlled substances, face-to-face visits are required. (Not an option and not up to the patient.)  Patient's Responsibilities: Pain Pills: Bring all pain pills to every appointment (except for procedure appointments). Pill counts are required.  Pill Bottles: Bring pills in original pharmacy bottle. Bring bottle, even if empty. Always bring the bottle of the most recent fill.  Medication refills: You are responsible for knowing and keeping track of what medications you are taking and when is it that you will need a refill. The day before your appointment: write a list of all prescriptions that need to be refilled. The day of the appointment: give the list to the admitting nurse. Prescriptions will be written only during appointments. No prescriptions will be written on procedure days. If you forget a medication: it will not be  "Called in", "Faxed", or "electronically sent". You will need to get another appointment to get these prescribed. No early refills. Do not call asking to have your prescription filled early. Partial  or short prescriptions: Occasionally your pharmacy may not have enough pills to fill your prescription.  NEVER ACCEPT a partial fill or a prescription that is short of the total amount of pills that you were prescribed.  With controlled substances the law allows 72 hours for the pharmacy to complete the prescription.  If the prescription is not completed within 72 hours, the pharmacist will require a new prescription to be written. This means that you will be short on your medicine and we WILL NOT send another prescription to complete your original prescription.  Instead, request the pharmacy to send a carrier to a nearby branch to get enough medication to provide you with your full prescription. Prescription Accuracy: You are responsible for carefully inspecting your prescriptions before leaving our office. Have the discharge nurse carefully go over each prescription with you, before taking them home. Make sure that your name is accurately spelled, that your address is correct. Check the name and dose of your medication to make sure it is accurate. Check the number of pills, and the written instructions to make sure they are clear and accurate. Make sure that you are given enough medication to last until your next medication refill appointment. Taking Medication: Take medication as prescribed. When it comes to controlled substances, taking less pills or less frequently than prescribed is permitted and encouraged. Never take more pills than instructed. Never take the medication more frequently than prescribed.  Inform other Doctors: Always inform, all of your healthcare providers, of all the medications you take. Pain Medication from other Providers: You are not allowed to accept any additional pain medication  from any  other Doctor or Healthcare provider. There are two exceptions to this rule. (see below) In the event that you require additional pain medication, you are responsible for notifying us, as stated below. Cough Medicine: Often these contain an opioid, such as codeine or hydrocodone. Never accept or take cough medicine containing these opioids if you are already taking an opioid* medication. The combination may cause respiratory failure and death. Medication Agreement: You are responsible for carefully reading and following our Medication Agreement. This must be signed before receiving any prescriptions from our practice. Safely store a copy of your signed Agreement. Violations to the Agreement will result in no further prescriptions. (Additional copies of our Medication Agreement are available upon request.) Laws, Rules, & Regulations: All patients are expected to follow all [address] and [employer], Rules, [employer]. Ignorance of the Laws does not constitute a valid excuse.  Illegal drugs and Controlled Substances: The use of illegal substances (including, but not limited to marijuana and its derivatives) and/or the illegal use of any controlled substances is strictly prohibited. Violation of this rule may result in the immediate and permanent discontinuation of any and all prescriptions being written by our practice. The use of any illegal substances is prohibited. Adopted CDC guidelines & recommendations: Target dosing levels will be at or below 60 MME/day. Use of benzodiazepines** is not recommended. Urine Drug testing: Patients taking controlled substances will be required to provide a urine sample upon request. Do not void before coming to your medication management appointments. Hold emptying your bladder until you are admitted. The admitting nurse will inform you if a sample is required. Our practice reserves the right to call you at any time to provide a sample. Once receiving the  call, you have 24 hours to comply with request. Not providing a sample upon request may result in termination of medication therapy.  Exceptions: There are only two exceptions to the rule of not receiving pain medications from other Healthcare Providers. Exception #1 (Emergencies): In the event of an emergency (i.e.: accident requiring emergency care), you are allowed to receive additional pain medication. However, you are responsible for: As soon as you are able, call our office [phone], at any time of the day or night, and leave a message stating your name, the date and nature of the emergency, and the name and dose of the medication prescribed. In the event that your call is answered by a member of our staff, make sure to document and save the date, time, and the name of the person that took your information.  Exception #2 (Planned Surgery): In the event that you are scheduled by another doctor or dentist to have any type of surgery or procedure, you are allowed (for a period no longer than 30 days), to receive additional pain medication, for the acute post-op pain. However, in this case, you are responsible for picking up a copy of our "Post-op Pain Management for Surgeons" handout, and giving it to your surgeon or dentist. This document is available at our office, and does not require an appointment to obtain it. Simply go to our office during business hours (Monday-Thursday from 8:00 AM to 4:00 PM) (Friday 8:00 AM to 12:00 Noon) or if you have a scheduled appointment with [place], prior to your surgery, and ask for it by name. In addition, you are responsible for: calling our office [phone], at any time of the day or night, and leaving a message stating your name, name of your surgeon,  type of surgery, and date of procedure or surgery. Failure to comply with your responsibilities may result in termination of therapy involving the controlled substances.  Consequences:  Non-compliance with the  above rules may result in permanent discontinuation of medication prescription therapy. All patients receiving any type of controlled substance is expected to comply with the above patient responsibilities. Not doing so may result in permanent discontinuation of medication prescription therapy. Medication Agreement Violation. Following the above rules, including your responsibilities will help you in avoiding a Medication Agreement Violation ("Breaking your Pain Medication Contract").  *Opioid medications include: morphine, codeine, oxycodone, oxymorphone, hydrocodone, hydromorphone, meperidine, tramadol, tapentadol, buprenorphine, fentanyl, methadone. **Benzodiazepine medications include: diazepam (Valium), alprazolam (Xanax), clonazepam (Klonopine), lorazepam (Ativan), clorazepate (Tranxene), chlordiazepoxide (Librium), estazolam (Prosom), oxazepam (Serax), temazepam (Restoril), triazolam (Halcion) (Last updated: 04/07/2023) ______________________________________________________________________

## 2024-05-10 ENCOUNTER — Other Ambulatory Visit: Payer: Self-pay

## 2024-05-10 DIAGNOSIS — M546 Pain in thoracic spine: Secondary | ICD-10-CM

## 2024-05-12 ENCOUNTER — Other Ambulatory Visit: Payer: Self-pay

## 2024-05-12 ENCOUNTER — Encounter (HOSPITAL_COMMUNITY): Payer: Self-pay

## 2024-05-12 NOTE — Progress Notes (Signed)
 PCP - [facility] Cardiologist -   PPM/ICD - denies Device Orders - n/a Rep Notified - n/a  Chest x-ray -  EKG - DOS Stress Test -  ECHO - 12-06-2021 Cardiac Cath -   CPAP - nightly  GLP-1 - denies  Fasting Blood Sugar - Per patient blood sugar ranges between 90-130 Checks Blood Sugar BID  Blood Thinner Instructions: clopidogrel (PLAVIX)  Aspirin Instructions:   ERAS Protcol - Clear liquids until 9:00 am  COVID TEST- n/a  Anesthesia review: Yes, HX of HTN, COPD, OSA, DM, Lung CA. Patient reports swelling in legs and has been taking torsemide  Patient verbally denies any shortness of breath, fever, cough and chest pain during phone call   -------------  SDW INSTRUCTIONS given:  Your procedure is scheduled on May 16, 2024.  Report to [facility] Main Entrance A at 9:30 A.M., and check in at the Admitting office.  Call this number if you have problems the morning of surgery:  [phone]   Remember:  Do not eat after midnight the night before your surgery  You may drink clear liquids until 9:00 the morning of your surgery.   Clear liquids allowed are: Water, Non-Citrus Juices (without pulp), Carbonated Beverages, Clear Tea, Black Coffee Only, and Gatorade    Take these medicines the morning of surgery with A SIP OF WATER  acetaminophen  (TYLENOL )  albuterol (PROVENTIL HFA;VENTOLIN HFA) inhaler  atorvastatin (LIPITOR)  colchicine  famotidine (PEPCID)  omeprazole (PRILOSEC)  prochlorperazine (COMPAZINE)  varenicline (CHANTIX)    WHAT DO I DO ABOUT MY DIABETES MEDICATION?   Do not take oral diabetes medicinesglipiZIDE (GLUCOTROL) the morning of surgery. Empagliflozin-metFORMIN - LAST DOSE:05-12-24         THE MORNING OF SURGERY, take 15 units of LANTUS insulin.  The day of surgery, do not take other diabetes injectables, including Byetta (exenatide), Bydureon (exenatide ER), Victoza (liraglutide), or Trulicity (dulaglutide).  If your CBG is greater  than 220 mg/dL, you may take  of your sliding scale (correction) dose of insulin.   HOW TO MANAGE YOUR DIABETES BEFORE AND AFTER SURGERY  Why is it important to control my blood sugar before and after surgery? Improving blood sugar levels before and after surgery helps healing and can limit problems. A way of improving blood sugar control is eating a healthy diet by:  Eating less sugar and carbohydrates  Increasing activity/exercise  Talking with your doctor about reaching your blood sugar goals High blood sugars (greater than 180 mg/dL) can raise your risk of infections and slow your recovery, so you will need to focus on controlling your diabetes during the weeks before surgery. Make sure that the doctor who takes care of your diabetes knows about your planned surgery including the date and location.  How do I manage my blood sugar before surgery? Check your blood sugar at least 4 times a day, starting 2 days before surgery, to make sure that the level is not too high or low.  Check your blood sugar the morning of your surgery when you wake up and every 2 hours until you get to the Short Stay unit.  If your blood sugar is less than 70 mg/dL, you will need to treat for low blood sugar: Do not take insulin. Treat a low blood sugar (less than 70 mg/dL) with  cup of clear juice (cranberry or apple), 4 glucose tablets, OR glucose gel. Recheck blood sugar in 15 minutes after treatment (to make sure it is greater than 70  mg/dL). If your blood sugar is not greater than 70 mg/dL on recheck, call [phone] for further instructions. Report your blood sugar to the short stay nurse when you get to Short Stay.  If you are admitted to the hospital after surgery: Your blood sugar will be checked by the staff and you will probably be given insulin after surgery (instead of oral diabetes medicines) to make sure you have good blood sugar levels. The goal for blood sugar control after surgery is  80-180 mg/dL.  As of today, STOP taking any Aspirin (unless otherwise instructed by your surgeon) Aleve, Naproxen, Ibuprofen, Motrin, Advil, Goody's, BC's, all herbal medications, fish oil, and all vitamins.                      Do not wear jewelry, make up, or nail polish            Do not wear lotions, powders, perfumes/colognes, or deodorant.            Do not shave 48 hours prior to surgery.  Men may shave face and neck.            Do not bring valuables to the hospital.            [facility] is not responsible for any belongings or valuables.  Do NOT Smoke (Tobacco/Vaping) 24 hours prior to your procedure If you use a CPAP at night, you may bring all equipment for your overnight stay.   Contacts, glasses, dentures or bridgework may not be worn into surgery.      For patients admitted to the hospital, discharge time will be determined by your treatment team.   Patients discharged the day of surgery will not be allowed to drive home, and someone needs to stay with them for 24 hours.    Special instructions:   [place]- Preparing For Surgery  Before surgery, you can play an important role. Because skin is not sterile, your skin needs to be as free of germs as possible. You can reduce the number of germs on your skin by washing with CHG (chlorahexidine gluconate) Soap before surgery.  CHG is an antiseptic cleaner which kills germs and bonds with the skin to continue killing germs even after washing.    Oral Hygiene is also important to reduce your risk of infection.  Remember - BRUSH YOUR TEETH THE MORNING OF SURGERY WITH YOUR REGULAR TOOTHPASTE  Please do not use if you have an allergy to CHG or antibacterial soaps. If your skin becomes reddened/irritated stop using the CHG.  Do not shave (including legs and underarms) for at least 48 hours prior to first CHG shower. It is OK to shave your face.  Please follow these instructions carefully.   Shower the NIGHT BEFORE SURGERY and  the MORNING OF SURGERY with DIAL Soap.   Pat yourself dry with a CLEAN TOWEL.  Wear CLEAN PAJAMAS to bed the night before surgery  Place CLEAN SHEETS on your bed the night of your first shower and DO NOT SLEEP WITH PETS.   Day of Surgery: Please shower morning of surgery  Wear Clean/Comfortable clothing the morning of surgery Do not apply any deodorants/lotions.   Remember to brush your teeth WITH YOUR REGULAR TOOTHPASTE.   Questions were answered. Patient verbalized understanding of instructions.

## 2024-05-15 ENCOUNTER — Encounter (HOSPITAL_COMMUNITY): Payer: Self-pay | Admitting: Vascular Surgery

## 2024-05-15 NOTE — Anesthesia Preprocedure Evaluation (Signed)
 Anesthesia Evaluation    Airway        Dental   Pulmonary Current Smoker          Cardiovascular hypertension,   Echo 02/21/2024 [facility]; during admission for nephrotic syndrome): Interpretation Summary Compared to prior study, there is no significant change.  Normal LV size. Mild LVH. LVEF>55% The RV is not well seen. Normal atrial sizes Moderate aortic stenosis: The peak aortic valve velocity 301 cm/s Aortic max pressure gradient= 32 mmHg.  Aortic mean pressure gradient= 18 mmHg.  Trivial aortic, tricuspid regurgitation No MR, PR Trivial pericardial effusion. The aortic root is normal size.     Neuro/Psych    GI/Hepatic   Endo/Other  diabetes    Renal/GU      Musculoskeletal   Abdominal   Peds  Hematology   Anesthesia Other Findings   Reproductive/Obstetrics                              Anesthesia Physical Anesthesia Plan  ASA:   Anesthesia Plan:    Post-op Pain Management:    Induction:   PONV Risk Score and Plan:   Airway Management Planned:   Additional Equipment:   Intra-op Plan:   Post-operative Plan:   Informed Consent:   Plan Discussed with:   Anesthesia Plan Comments: (PAT note written 05/15/2024 by Isaiah Ruder, PA-C. H&P 05/04/2024 [facility]).  )        Anesthesia Quick Evaluation

## 2024-05-15 NOTE — Progress Notes (Signed)
 Anesthesia Chart Review: Jerome MICHAE [name]  Case: [id number] Date/Time: 05/16/24 1145   Procedure: MRI WITH ANESTHESIA - MRI SPINE WITH AND WITHOUT CONTRAST   Anesthesia type: General   Pre-op diagnosis: PAIN IN THORASIC SPINE   Location: [facility] OR RADIOLOGY ROOM / [facility] OR   Surgeons: Radiologist, Medication, MD       DISCUSSION: Patient is a 63 year old male scheduled for a MRI of the T-spine under anesthesia. MRI was ordered by Nat Poser, NP with [facility]. She last saw him on 05/04/2024 (see [facility] Care Everywhere).  History includes smoking, HTN, DM2, COPD, non-small cell lung cancer, OSA, chronic mesenteric ischemia (s/p SMA stent by IR 02/2021), clotting disorder (not specified; superficial venous thrombus RUE 01/31/2024), psoriasis, aortic stenosis (moderate 02/21/2024 TTE).  Per [facility] oncology noted, He was diagnosed with RUL/RLL NSCLC after 03/31/2022 biopsy. S/p XRT 50Gy 06/17/2022. He had biopsy proven recurrence RLL 1/212/2025. Also question LUL malignancy. Carboplatin, paclitaxel, pembrolizumab initiated in April 2025. He was admitted for PNA in May 2025. He had some dose reductions in therapy over the past several months. He started single agent pembrolizumab in July 2025. Follow-up in ~ 7 weeks planned with re-imaging.   He is on Xarelto and Plavix. [facility] notes indicated he is on Plavix for history of mesenteric artery stents. Notes suggest he is on Xarelto for history of venous thrombosis. He has been primary wheelchair bond (requires assistance to walk) for > 5 years.   He is a same day work-up. Anesthesia team to evaluate on the day of surgery. Updated labs and EKG on arrival as indicated.   VS: Ht 5' 9 (1.753 m)   Wt 122.5 kg   BMI 39.87 kg/m  SYSTOLIC BLOOD PRESSURE 104 05/04/2024 07:53:02 [facility]  DIASTOLIC BLOOD PRESSURE 61 05/04/2024 07:53:02 [facility]  PULSE OXIMETRY 92 % 05/04/2024 07:53:02 [facility]  WEIGHT 273.7  05/04/2024 07:53:02 [facility]  BMI 41 kg/m2 05/04/2024 07:53:02 [facility]  PAIN 9 05/04/2024 07:53:02 [facility]  HEIGHT 69 05/04/2024 07:53:02 [facility]  TEMPERATURE 97.4 05/04/2024 07:53:02 [facility]  PULSE 100 05/04/2024 07:53:02 [facility]  RESPIRATION 18 05/04/2024 07:53:02  [facility]    PROVIDERS: Center, [facility] is with Juleen Trudy Hussar, MD is vascular surgery with Devere JINNY Ernst, PA-C [facility]) Fredonia Dover, MD is nephrologist [facility]). Last visit 04/11/2024 with Cline Moll, DNP, NP-BC.   LABS: For day of procedure as indicated. As of 05/04/2024 [facility]): Glucose 140, TSH 4.392, albumin 2.0, Alk phos 115, ALT 16, AST 17, Tbili 0.50, Ca 9.0, CO2 33.2, Cr 0.6, eGFR > 90, glucose 123, K 4.4, protein 6.9, Na 136, BUN 13, WBC 8.4, H/H 15.6/53.9, PLT 284. A1c 7.6% 02/10/2024.    OTHER: Home Sleep Test 04/22/2023: - Calculated pAHI (per hour):    14.3/h . The patient did not enter and fragmented sleep until about midnight when he started to use his positive airway pressure therapy again and was able to sleep uninterrupted from about 0.45 a.m. through 5:45 AM. - REM pAHI:    22.7/h   by AASM criteria                                        - NREM pAHI:    12.5/h  by AASM criteria                    - Supine AHI:      The total study was completed by the patient slept in his reclining power chair.                                           - O2 Saturation Range (%):   Between a nadir at 83% and the maximum saturation of 96% with a mean saturation of 91%.                                  - O2 Saturation (minutes) <89%:    16.6 minutes or 3.4% of total sleep time.     IMPRESSION:  This HST confirms the presence of obstructive sleep apnea when the patient attempted to sleep without positive airway pressure.  Once PAP therapy was applied, sleep became less  fragmented and there was no evidence of persistent apneas and hypopneas. The patient did not suffer from sleep hypoxemia while treated with positive airway pressure therapy and not being on oxygen. RECOMMENDATION: Mr. Leaf requires obviously positive airway pressure therapy but he is not dependent on oxygen supplementation.  Sleep on biPAP was less fragmented with almost no residual apnea hypopnea index, but did not show hypoxemia. His bipap machine is less than 62 years old and will not need replacement. Oxygen can be d/c.    IMAGES: CT Chest/abd/pelvis 03/23/2024 [facility]): Impression: 1. Slight interval decrease in size of the large, necrotic right lower lobe mass with involvement of the pleura and right  hemidiaphragm. Additionally, the dominant right upper lobe  irregular nodular opacity and smaller bilateral perifissural  nodules have also decreased in size, as compared to February 18, 2024.  2. Decreased mediastinal and right hilar lymphadenopathy, as  compared to February 18, 2024.  3. No evidence of metastatic disease in the abdomen or pelvis.   CTA Chest 02/18/2024 [facility]): Impression: 1. No evidence of pulmonary embolus.  2. Continued mild enlargement of an irregular subpleural nodule  in the right upper lobe. Other findings of intrathoracic  malignancy are unchanged.   MRI Brain 07/30/2023  [facility]): Impression: 1. No evidence of intracranial metastasis.  2. 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  3. Air-fluid level within right maxillary sinus which is nonspecific; however, recommend correlation for acute sinusitis.    EKG: For day of procedure as indicated. Per [facility] Narrative, EKG on 12/05/2020 showed: Normal sinus rhythm  Left atrial enlargement  Low voltage throughout  Late precordial R/S transition  Otherwise normal ECG    CV: Echo 02/21/2024 [facility]; during admission for nephrotic syndrome): Interpretation Summary Compared to prior  study, there is no significant change.  Normal LV size. Mild LVH. LVEF>55% The RV is not well seen. Normal atrial sizes Moderate aortic stenosis: The peak aortic valve velocity 301 cm/s Aortic max pressure gradient= 32 mmHg.  Aortic mean pressure gradient= 18 mmHg.  Trivial aortic, tricuspid regurgitation No MR, PR Trivial pericardial effusion. The aortic root is normal size.  - Comparison TTE [facility] 08/11/2023 appeared to be have been ordered per [facility] vascular: LVEF > 55%, mild LVH, moderate AS, peak AV velocity 295 cm/s, AV mean gradient 23 mmHg, AV max pressure  gradient 35 mmHg; TTE [facility] 12/06/2020: LVEF > 55%, trivial TR, no valvular stenosis, trivial pericardial effusion  LE Venous US  02/19/2024 [facility]): Findings:  The right and left common femoral vein, femoral vein, and  popliteal vein are compressible and opacify upon color Doppler  evaluation. No evidence of deep vein thrombosis.  Impression: 1. Negative for deep vein thrombosis.   RUE Venous US  01/31/2024 [facility]): Impression: 1. No deep venous thrombosis identified in the right upper  extremity.  2. Superficial venous thrombus present focally in the central  aspect of the right basilic vein and mid aspect of the right  cephalic vein. These findings correlate to palpable abnormalities  indicated by the patient.    Past Medical History:  Diagnosis Date   Aortic stenosis    moderate AS by 02/21/2024 TTE ([facility])   Arthritis    Cancer (HCC)    Clotting disorder    COPD (chronic obstructive pulmonary disease) (HCC)    Diabetes mellitus without complication (HCC)    Hyperlipidemia    Hypertension    Oxygen deficiency    Sleep apnea     Past Surgical History:  Procedure Laterality Date   KNEE SURGERY Left    mesentric     Insufficiency of mesentric artery    MEDICATIONS: No current facility-administered medications for this encounter.    insulin aspart (NOVOLOG) 100 UNIT/ML injection   methadone (DOLOPHINE)  5 MG tablet   oxyCODONE  (ROXICODONE ) 15 MG immediate release tablet   pregabalin (LYRICA) 75 MG capsule   rivaroxaban (XARELTO) 10 MG TABS tablet   torsemide (DEMADEX) 20 MG tablet   acetaminophen  (TYLENOL ) 325 MG tablet   albuterol (PROVENTIL HFA;VENTOLIN HFA) 108 (90 Base) MCG/ACT inhaler   aspirin EC 81 MG tablet   atorvastatin (LIPITOR) 80 MG tablet   clopidogrel (PLAVIX) 75 MG tablet   colchicine 0.6 MG tablet   Empagliflozin-metFORMIN HCl 12.10-998 MG TABS   famotidine (PEPCID) 20 MG tablet   gabapentin (NEURONTIN) 800 MG tablet   glipiZIDE (GLUCOTROL) 10 MG tablet   insulin glargine (LANTUS) 100 UNIT/ML injection   lidocaine (LIDODERM) 5 %   lisinopril (PRINIVIL,ZESTRIL) 40 MG tablet   omeprazole (PRILOSEC) 40 MG capsule   prochlorperazine (COMPAZINE) 5 MG tablet   senna (SENOKOT) 8.6 MG TABS tablet   Tiotropium Bromide-Olodaterol 2.5-2.5 MCG/ACT AERS   varenicline (CHANTIX) 1 MG tablet   zolpidem (AMBIEN) 10 MG tablet   He is not currently taking acetaminophen , ASA 81 mg, colchicine, famotidine, gabapentin, Lidoderm, lisinopril, prochlorperazine, Chantix.   Isaiah Ruder, PA-C Surgical Short Stay/Anesthesiology [facility] Phone [phone] [facility] Phone [phone] 05/15/2024 1:28 PM

## 2024-05-16 ENCOUNTER — Emergency Department

## 2024-05-16 ENCOUNTER — Other Ambulatory Visit: Payer: Self-pay

## 2024-05-16 ENCOUNTER — Inpatient Hospital Stay
Admission: EM | Admit: 2024-05-16 | Discharge: 2024-05-21 | DRG: 193 | Disposition: A | Discharge status: Home Health | Attending: Family Medicine | Admitting: Family Medicine

## 2024-05-16 ENCOUNTER — Encounter (HOSPITAL_COMMUNITY): Payer: Self-pay

## 2024-05-16 ENCOUNTER — Ambulatory Visit (HOSPITAL_COMMUNITY): Admission: RE | Admit: 2024-05-16 | Source: Ambulatory Visit

## 2024-05-16 ENCOUNTER — Encounter: Payer: Self-pay | Admitting: Medical Oncology

## 2024-05-16 ENCOUNTER — Encounter (HOSPITAL_COMMUNITY): Payer: Self-pay | Admitting: Registered Nurse

## 2024-05-16 ENCOUNTER — Ambulatory Visit (HOSPITAL_COMMUNITY): Admit: 2024-05-16

## 2024-05-16 ENCOUNTER — Inpatient Hospital Stay: Admit: 2024-05-16 | Discharge: 2024-05-16 | Disposition: A | Attending: Internal Medicine

## 2024-05-16 DIAGNOSIS — J918 Pleural effusion in other conditions classified elsewhere: Secondary | ICD-10-CM | POA: Diagnosis present

## 2024-05-16 DIAGNOSIS — E1165 Type 2 diabetes mellitus with hyperglycemia: Secondary | ICD-10-CM | POA: Diagnosis present

## 2024-05-16 DIAGNOSIS — I5031 Acute diastolic (congestive) heart failure: Secondary | ICD-10-CM | POA: Diagnosis not present

## 2024-05-16 DIAGNOSIS — Z7902 Long term (current) use of antithrombotics/antiplatelets: Secondary | ICD-10-CM | POA: Diagnosis not present

## 2024-05-16 DIAGNOSIS — J9601 Acute respiratory failure with hypoxia: Secondary | ICD-10-CM | POA: Diagnosis not present

## 2024-05-16 DIAGNOSIS — I2489 Other forms of acute ischemic heart disease: Secondary | ICD-10-CM | POA: Diagnosis present

## 2024-05-16 DIAGNOSIS — J189 Pneumonia, unspecified organism: Secondary | ICD-10-CM | POA: Diagnosis present

## 2024-05-16 DIAGNOSIS — D689 Coagulation defect, unspecified: Secondary | ICD-10-CM | POA: Diagnosis present

## 2024-05-16 DIAGNOSIS — G893 Neoplasm related pain (acute) (chronic): Secondary | ICD-10-CM | POA: Diagnosis not present

## 2024-05-16 DIAGNOSIS — G4733 Obstructive sleep apnea (adult) (pediatric): Secondary | ICD-10-CM | POA: Insufficient documentation

## 2024-05-16 DIAGNOSIS — C3431 Malignant neoplasm of lower lobe, right bronchus or lung: Secondary | ICD-10-CM | POA: Diagnosis present

## 2024-05-16 DIAGNOSIS — G9341 Metabolic encephalopathy: Secondary | ICD-10-CM | POA: Diagnosis present

## 2024-05-16 DIAGNOSIS — Z6841 Body Mass Index (BMI) 40.0 and over, adult: Secondary | ICD-10-CM | POA: Diagnosis not present

## 2024-05-16 DIAGNOSIS — G894 Chronic pain syndrome: Secondary | ICD-10-CM | POA: Diagnosis not present

## 2024-05-16 DIAGNOSIS — Z794 Long term (current) use of insulin: Secondary | ICD-10-CM | POA: Diagnosis not present

## 2024-05-16 DIAGNOSIS — C787 Secondary malignant neoplasm of liver and intrahepatic bile duct: Secondary | ICD-10-CM | POA: Diagnosis present

## 2024-05-16 DIAGNOSIS — J440 Chronic obstructive pulmonary disease with acute lower respiratory infection: Secondary | ICD-10-CM | POA: Diagnosis present

## 2024-05-16 DIAGNOSIS — E66813 Obesity, class 3: Secondary | ICD-10-CM | POA: Diagnosis present

## 2024-05-16 DIAGNOSIS — E1140 Type 2 diabetes mellitus with diabetic neuropathy, unspecified: Secondary | ICD-10-CM | POA: Diagnosis present

## 2024-05-16 DIAGNOSIS — I110 Hypertensive heart disease with heart failure: Secondary | ICD-10-CM | POA: Diagnosis present

## 2024-05-16 DIAGNOSIS — Z515 Encounter for palliative care: Secondary | ICD-10-CM | POA: Diagnosis not present

## 2024-05-16 DIAGNOSIS — I350 Nonrheumatic aortic (valve) stenosis: Secondary | ICD-10-CM | POA: Diagnosis present

## 2024-05-16 DIAGNOSIS — J441 Chronic obstructive pulmonary disease with (acute) exacerbation: Secondary | ICD-10-CM | POA: Insufficient documentation

## 2024-05-16 DIAGNOSIS — Z7901 Long term (current) use of anticoagulants: Secondary | ICD-10-CM | POA: Diagnosis not present

## 2024-05-16 DIAGNOSIS — J9602 Acute respiratory failure with hypercapnia: Secondary | ICD-10-CM | POA: Diagnosis present

## 2024-05-16 DIAGNOSIS — I5033 Acute on chronic diastolic (congestive) heart failure: Secondary | ICD-10-CM | POA: Diagnosis present

## 2024-05-16 DIAGNOSIS — A419 Sepsis, unspecified organism: Principal | ICD-10-CM

## 2024-05-16 DIAGNOSIS — E8729 Other acidosis: Secondary | ICD-10-CM | POA: Diagnosis present

## 2024-05-16 DIAGNOSIS — Z7189 Other specified counseling: Secondary | ICD-10-CM | POA: Diagnosis not present

## 2024-05-16 DIAGNOSIS — Z1152 Encounter for screening for COVID-19: Secondary | ICD-10-CM | POA: Diagnosis not present

## 2024-05-16 HISTORY — DX: Nonrheumatic aortic (valve) stenosis: I35.0

## 2024-05-16 LAB — CBG MONITORING, ED
Glucose-Capillary: 184 mg/dL — ABNORMAL HIGH (ref 70–99)
Glucose-Capillary: 259 mg/dL — ABNORMAL HIGH (ref 70–99)

## 2024-05-16 LAB — URINALYSIS, W/ REFLEX TO CULTURE (INFECTION SUSPECTED)
Bacteria, UA: NONE SEEN
Bilirubin Urine: NEGATIVE
Glucose, UA: >=500 mg/dL — AB
Hgb urine dipstick: NEGATIVE
Ketones, ur: NEGATIVE mg/dL
Leukocytes,Ua: NEGATIVE
Nitrite: NEGATIVE
Protein, ur: >=300 mg/dL — AB
Specific Gravity, Urine: 1.038 — ABNORMAL HIGH (ref 1.005–1.030)
pH: 5 (ref 5.0–8.0)

## 2024-05-16 LAB — CBC WITH DIFFERENTIAL/PLATELET
Abs Immature Granulocytes: 0.06 K/uL (ref 0.00–0.07)
Basophils Absolute: 0.1 K/uL (ref 0.0–0.1)
Basophils Relative: 0 %
Eosinophils Absolute: 0 K/uL (ref 0.0–0.5)
Eosinophils Relative: 0 %
HCT: 47.2 % (ref 39.0–52.0)
Hemoglobin: 14.2 g/dL (ref 13.0–17.0)
Immature Granulocytes: 1 %
Lymphocytes Relative: 6 %
Lymphs Abs: 0.8 K/uL (ref 0.7–4.0)
MCH: 25.6 pg — ABNORMAL LOW (ref 26.0–34.0)
MCHC: 30.1 g/dL (ref 30.0–36.0)
MCV: 85 fL (ref 80.0–100.0)
Monocytes Absolute: 0.9 K/uL (ref 0.1–1.0)
Monocytes Relative: 7 %
Neutro Abs: 10.7 K/uL — ABNORMAL HIGH (ref 1.7–7.7)
Neutrophils Relative %: 86 %
Platelets: 274 K/uL (ref 150–400)
RBC: 5.55 MIL/uL (ref 4.22–5.81)
RDW: 19.5 % — ABNORMAL HIGH (ref 11.5–15.5)
WBC: 12.5 K/uL — ABNORMAL HIGH (ref 4.0–10.5)
nRBC: 0.2 % (ref 0.0–0.2)

## 2024-05-16 LAB — COMPREHENSIVE METABOLIC PANEL WITH GFR
ALT: 22 U/L (ref 0–44)
AST: 55 U/L — ABNORMAL HIGH (ref 15–41)
Albumin: 2.6 g/dL — ABNORMAL LOW (ref 3.5–5.0)
Alkaline Phosphatase: 99 U/L (ref 38–126)
Anion gap: 8 (ref 5–15)
BUN: 19 mg/dL (ref 8–23)
CO2: 36 mmol/L — ABNORMAL HIGH (ref 22–32)
Calcium: 8.7 mg/dL — ABNORMAL LOW (ref 8.9–10.3)
Chloride: 94 mmol/L — ABNORMAL LOW (ref 98–111)
Creatinine, Ser: 0.56 mg/dL — ABNORMAL LOW (ref 0.61–1.24)
GFR, Estimated: >60 mL/min (ref >60)
Glucose, Bld: 159 mg/dL — ABNORMAL HIGH (ref 70–99)
Potassium: 4 mmol/L (ref 3.5–5.1)
Sodium: 138 mmol/L (ref 135–145)
Total Bilirubin: 0.7 mg/dL (ref 0.0–1.2)
Total Protein: 6.8 g/dL (ref 6.5–8.1)

## 2024-05-16 LAB — BLOOD GAS, VENOUS
Acid-Base Excess: 14.9 mmol/L — ABNORMAL HIGH (ref 0.0–2.0)
Acid-Base Excess: 13.5 mmol/L — ABNORMAL HIGH (ref 0.0–2.0)
Bicarbonate: 42.1 mmol/L — ABNORMAL HIGH (ref 20.0–28.0)
Bicarbonate: 42 mmol/L — ABNORMAL HIGH (ref 20.0–28.0)
O2 Saturation: 98.8 %
O2 Saturation: 92 %
Patient temperature: 37
Patient temperature: 37
pCO2, Ven: 62 mmHg — ABNORMAL HIGH (ref 44–60)
pCO2, Ven: 71 mmHg (ref 44–60)
pH, Ven: 7.44 — ABNORMAL HIGH (ref 7.25–7.43)
pH, Ven: 7.38 (ref 7.25–7.43)
pO2, Ven: 97 mmHg — ABNORMAL HIGH (ref 32–45)
pO2, Ven: 62 mmHg — ABNORMAL HIGH (ref 32–45)

## 2024-05-16 LAB — RESP PANEL BY RT-PCR (RSV, FLU A&B, COVID)  RVPGX2
Influenza A by PCR: NEGATIVE
Influenza B by PCR: NEGATIVE
Resp Syncytial Virus by PCR: NEGATIVE
SARS Coronavirus 2 by RT PCR: NEGATIVE

## 2024-05-16 LAB — PRO BRAIN NATRIURETIC PEPTIDE: Pro Brain Natriuretic Peptide: 3068 pg/mL — ABNORMAL HIGH (ref <300.0)

## 2024-05-16 LAB — TROPONIN T, HIGH SENSITIVITY
Troponin T High Sensitivity: 105 ng/L (ref 0–19)
Troponin T High Sensitivity: 97 ng/L — ABNORMAL HIGH (ref 0–19)

## 2024-05-16 LAB — LACTIC ACID, PLASMA
Lactic Acid, Venous: 1.9 mmol/L (ref 0.5–1.9)
Lactic Acid, Venous: 1.1 mmol/L (ref 0.5–1.9)

## 2024-05-16 LAB — PROTIME-INR
INR: 1.2 (ref 0.8–1.2)
Prothrombin Time: 15.7 s — ABNORMAL HIGH (ref 11.4–15.2)

## 2024-05-16 SURGERY — MRI WITH ANESTHESIA
Anesthesia: General

## 2024-05-16 MED ORDER — SODIUM CHLORIDE 0.9 % IV BOLUS: 500.0000 mL | Freq: Once | INTRAVENOUS | Status: DC

## 2024-05-16 MED ORDER — ARFORMOTEROL TARTRATE 15 MCG/2ML IN NEBU
15.0000 ug | INHALATION_SOLUTION | Freq: Two times a day (BID) | RESPIRATORY_TRACT | Status: DC
  Administered 2024-05-16 – 2024-05-21 (×9): 15 ug via RESPIRATORY_TRACT
  Filled 2024-05-16 (×12): qty 2

## 2024-05-16 MED ORDER — FUROSEMIDE 10 MG/ML IJ SOLN
60.0000 mg | Freq: Two times a day (BID) | INTRAMUSCULAR | Status: DC
  Administered 2024-05-16 – 2024-05-20 (×8): 60 mg via INTRAVENOUS
  Filled 2024-05-16: qty 8
  Filled 2024-05-16 (×7): qty 6

## 2024-05-16 MED ORDER — RIVAROXABAN 10 MG PO TABS
10.0000 mg | ORAL_TABLET | Freq: Every day | ORAL | Status: DC
  Administered 2024-05-17 – 2024-05-21 (×5): 10 mg via ORAL
  Filled 2024-05-16 (×5): qty 1

## 2024-05-16 MED ORDER — PREDNISONE 20 MG PO TABS
40.0000 mg | ORAL_TABLET | Freq: Every day | ORAL | Status: DC
  Administered 2024-05-17 – 2024-05-21 (×5): 40 mg via ORAL
  Filled 2024-05-16 (×5): qty 2

## 2024-05-16 MED ORDER — ENOXAPARIN SODIUM 60 MG/0.6ML IJ SOSY: 0.5000 mg/kg | PREFILLED_SYRINGE | INTRAMUSCULAR | Status: DC

## 2024-05-16 MED ORDER — OXYCODONE HCL 5 MG PO TABS
15.0000 mg | ORAL_TABLET | ORAL | Status: DC | PRN
  Administered 2024-05-16 – 2024-05-17 (×4): 15 mg via ORAL
  Filled 2024-05-16 (×4): qty 3

## 2024-05-16 MED ORDER — INSULIN ASPART 100 UNIT/ML IJ SOLN
0.0000 [IU] | Freq: Three times a day (TID) | INTRAMUSCULAR | Status: DC
  Administered 2024-05-17 (×2): 5 [IU] via SUBCUTANEOUS
  Administered 2024-05-17: 3 [IU] via SUBCUTANEOUS
  Administered 2024-05-18: 1 [IU] via SUBCUTANEOUS
  Administered 2024-05-18: 2 [IU] via SUBCUTANEOUS
  Administered 2024-05-19: 5 [IU] via SUBCUTANEOUS
  Administered 2024-05-19: 3 [IU] via SUBCUTANEOUS
  Administered 2024-05-19: 1 [IU] via SUBCUTANEOUS
  Administered 2024-05-20: 2 [IU] via SUBCUTANEOUS
  Administered 2024-05-20: 3 [IU] via SUBCUTANEOUS
  Administered 2024-05-21: 5 [IU] via SUBCUTANEOUS
  Filled 2024-05-16: qty 3
  Filled 2024-05-16: qty 1
  Filled 2024-05-16: qty 2
  Filled 2024-05-16 (×2): qty 5
  Filled 2024-05-16 (×2): qty 3
  Filled 2024-05-16: qty 5
  Filled 2024-05-16: qty 1
  Filled 2024-05-16: qty 2
  Filled 2024-05-16: qty 5

## 2024-05-16 MED ORDER — METRONIDAZOLE 500 MG/100ML IV SOLN
500.0000 mg | Freq: Once | INTRAVENOUS | Status: AC
  Administered 2024-05-16: 500 mg via INTRAVENOUS
  Filled 2024-05-16: qty 100

## 2024-05-16 MED ORDER — SODIUM CHLORIDE 0.9 % IV SOLN
2.0000 g | Freq: Once | INTRAVENOUS | Status: AC
  Administered 2024-05-16: 2 g via INTRAVENOUS
  Filled 2024-05-16: qty 12.5

## 2024-05-16 MED ORDER — IOHEXOL 350 MG/ML SOLN
100.0000 mL | Freq: Once | INTRAVENOUS | Status: AC | PRN
  Administered 2024-05-16: 100 mL via INTRAVENOUS

## 2024-05-16 MED ORDER — ATORVASTATIN CALCIUM 80 MG PO TABS
80.0000 mg | ORAL_TABLET | Freq: Every day | ORAL | Status: DC
  Administered 2024-05-16 – 2024-05-20 (×5): 80 mg via ORAL
  Filled 2024-05-16 (×4): qty 1
  Filled 2024-05-16: qty 4

## 2024-05-16 MED ORDER — AZITHROMYCIN 250 MG PO TABS
500.0000 mg | ORAL_TABLET | Freq: Every day | ORAL | Status: DC
  Administered 2024-05-16 – 2024-05-20 (×5): 500 mg via ORAL
  Filled 2024-05-16: qty 1
  Filled 2024-05-16 (×4): qty 2

## 2024-05-16 MED ORDER — INSULIN GLARGINE-YFGN 100 UNIT/ML ~~LOC~~ SOLN
15.0000 [IU] | Freq: Every day | SUBCUTANEOUS | Status: DC
  Administered 2024-05-17 – 2024-05-20 (×5): 15 [IU] via SUBCUTANEOUS
  Filled 2024-05-16 (×7): qty 0.15

## 2024-05-16 MED ORDER — IPRATROPIUM-ALBUTEROL 0.5-2.5 (3) MG/3ML IN SOLN
9.0000 mL | Freq: Once | RESPIRATORY_TRACT | Status: AC
  Administered 2024-05-16: 9 mL via RESPIRATORY_TRACT
  Filled 2024-05-16: qty 9

## 2024-05-16 MED ORDER — SODIUM CHLORIDE 0.9 % IV SOLN
1.0000 g | INTRAVENOUS | Status: AC
  Administered 2024-05-16 – 2024-05-20 (×5): 1 g via INTRAVENOUS
  Filled 2024-05-16 (×5): qty 10

## 2024-05-16 MED ORDER — IPRATROPIUM-ALBUTEROL 0.5-2.5 (3) MG/3ML IN SOLN
3.0000 mL | Freq: Four times a day (QID) | RESPIRATORY_TRACT | Status: DC
  Administered 2024-05-16 – 2024-05-17 (×3): 3 mL via RESPIRATORY_TRACT
  Filled 2024-05-16 (×3): qty 3

## 2024-05-16 MED ORDER — METHADONE HCL 5 MG PO TABS
7.5000 mg | ORAL_TABLET | Freq: Three times a day (TID) | ORAL | Status: DC
  Administered 2024-05-16 – 2024-05-21 (×15): 7.5 mg via ORAL
  Filled 2024-05-16 (×17): qty 2

## 2024-05-16 MED ORDER — CLOPIDOGREL BISULFATE 75 MG PO TABS
75.0000 mg | ORAL_TABLET | Freq: Every day | ORAL | Status: DC
  Administered 2024-05-17 – 2024-05-21 (×5): 75 mg via ORAL
  Filled 2024-05-16 (×5): qty 1

## 2024-05-16 MED ORDER — UMECLIDINIUM BROMIDE 62.5 MCG/ACT IN AEPB
1.0000 | INHALATION_SPRAY | Freq: Every day | RESPIRATORY_TRACT | Status: DC
  Administered 2024-05-16 – 2024-05-20 (×5): 1 via RESPIRATORY_TRACT
  Filled 2024-05-16 (×2): qty 7

## 2024-05-16 MED ORDER — VANCOMYCIN HCL IN DEXTROSE 1-5 GM/200ML-% IV SOLN
1000.0000 mg | Freq: Once | INTRAVENOUS | Status: AC
  Administered 2024-05-16: 1000 mg via INTRAVENOUS
  Filled 2024-05-16: qty 200

## 2024-05-16 MED ORDER — METHYLPREDNISOLONE SODIUM SUCC 125 MG IJ SOLR
125.0000 mg | Freq: Once | INTRAMUSCULAR | Status: AC
  Administered 2024-05-16: 125 mg via INTRAVENOUS
  Filled 2024-05-16: qty 2

## 2024-05-16 MED ORDER — INSULIN ASPART 100 UNIT/ML IJ SOLN
0.0000 [IU] | Freq: Every day | INTRAMUSCULAR | Status: DC
  Administered 2024-05-16 – 2024-05-18 (×3): 3 [IU] via SUBCUTANEOUS
  Administered 2024-05-19: 2 [IU] via SUBCUTANEOUS
  Filled 2024-05-16 (×4): qty 3

## 2024-05-16 NOTE — ED Triage Notes (Signed)
 Pt from home via ems with reports that pt was transferring from bed to wheelchair when he fall, ems was called, when fire arrived pt had O2 sats of 50% on RA. Pt was placed on 15L NRB. While transferring back to bed pt had witnessed syncopal episode.  VS: 110/66, 101.1, 24RR  18g IV was started and 1gm of IV tylenol  given pta.

## 2024-05-16 NOTE — ED Notes (Signed)
 RT called informing them that pt is being transported to assigned room 236. RT to transport BiPap. Per report from primary RN pt has been on 6 L [place]. Pt transported on [place] and monitor by this RN

## 2024-05-16 NOTE — ED Notes (Signed)
 Pt now not agreeable to replace mask until he gets his home dose of methadone. Message sent via secure chat to Dr. Lawence

## 2024-05-16 NOTE — Progress Notes (Signed)
 CODE SEPSIS - PHARMACY COMMUNICATION  **Broad Spectrum Antibiotics should be administered within 1 hour of Sepsis diagnosis**  Time Code Sepsis Called/Page Received: 10:26  Antibiotics Ordered: Cefepime, vancomycin, flagyl  Time of 1st antibiotic administration: 11:03  Additional action taken by pharmacy: N/A  If necessary, Name of Provider/Nurse Contacted: N/A    Ransom Blanch PGY-1 Pharmacy Resident  [place] [facility]  05/16/2024 10:53 AM

## 2024-05-16 NOTE — ED Notes (Signed)
 Report received from offgoing RN. Pt resting in bed, BiPap mask off as pt requested a drink prior to placing mask back on. Pt inquiring about his home doses of methadone. None noted to be ordered but seen on his medication list. Will message admitting provider for order.

## 2024-05-16 NOTE — ED Notes (Signed)
 Pt remains in significant amount of pain. Methadone ordered however not available in the pyxis yet. Pt not allowing any additional treatment (BiPap mask, labs, med admin) until he receives his methadone. Message sent to admitting provider and pharmacy

## 2024-05-16 NOTE — ED Notes (Signed)
 Attempted to replace mask again, pt requests 1 more drink before replacing mask. Pt provided with gingerale

## 2024-05-16 NOTE — Sepsis Progress Note (Signed)
 Elink will follow per sepsis protocol.

## 2024-05-16 NOTE — ED Provider Notes (Signed)
 [facility] Provider Note    Event Date/Time   First MD Initiated Contact with Patient 05/16/24 1001     (approximate)   History   Loss of Consciousness and Shortness of Breath   HPI  Jerome [name] is a 63 y.o. male who comes in for a fall.  Patient was transferring from bed to wheelchair when they had a fall.  EMS was called and patient was found to be satting in the 50s on room air.  Patient was placed on 15 L nonrebreather.  Patient did have another witnessed syncopal episode when transferring back to the bed.  Patient had to be febrile.  Patient has a known history of cancer currently on chemotherapy.  He reports lung cancer.  He reports having a fall but does not really remember exactly how it happened.  He reports that he was supposed to go into [facility] today for an MRI of in his back.  He does report taking medications for fluid.  He reports that the swelling has gotten a little bit worse in his legs.  He does report a little bit of redness and pain to his lower abdomen.  Patient also reports that he gets all of his care over at the [facility].     Physical Exam   Triage Vital Signs: ED Triage Vitals  Encounter Vitals Group     BP --      Girls Systolic BP Percentile --      Girls Diastolic BP Percentile --      Boys Systolic BP Percentile --      Boys Diastolic BP Percentile --      Pulse Rate 05/16/24 1006 (!) 111     Resp 05/16/24 1006 (!) 22     Temp 05/16/24 1006 100.3 F (37.9 C)     Temp Source 05/16/24 1006 Axillary     SpO2 05/16/24 1002 (!) 50 %     Weight 05/16/24 1007 268 lb 15.4 oz (122 kg)     Height 05/16/24 1007 5' 9 (1.753 m)     Head Circumference --      Peak Flow --      Pain Score 05/16/24 1007 0     Pain Loc --      Pain Education --      Exclude from Growth Chart --     Most recent vital signs: Vitals:   05/16/24 1002 05/16/24 1006  Pulse:  (!) 111  Resp:  (!) 22  Temp:  100.3 F (37.9 C)  SpO2: (!) 50% 100%      General: Awake, no distress.  CV:  Good peripheral perfusion.  On 6 L Resp:  Normal effort.  Abd:  No distention.  Slight tenderness in lower abdomen with some redness and thickened skin noted.  No obvious changes to the testicles. Other:  1+ edema noted in his legs with warmth and redness noted more on the right leg.   ED Results / Procedures / Treatments   Labs (all labs ordered are listed, but only abnormal results are displayed) Labs Reviewed  CULTURE, BLOOD (ROUTINE X 2)  CULTURE, BLOOD (ROUTINE X 2)  COMPREHENSIVE METABOLIC PANEL WITH GFR  LACTIC ACID, PLASMA  LACTIC ACID, PLASMA  CBC WITH DIFFERENTIAL/PLATELET  PROTIME-INR  URINALYSIS, W/ REFLEX TO CULTURE (INFECTION SUSPECTED)  BLOOD GAS, VENOUS     EKG  My interpretation of EKG:  Sinus tachycardia rate of 114 without any ST elevation or T  wave inversions, occasional PVC  RADIOLOGY I have reviewed the xray personally and interpreted possible edema   PROCEDURES:  Critical Care performed: Yes, see critical care procedure note(s)  .1-3 Lead EKG Interpretation  Performed by: Ernest Ronal BRAVO, MD Authorized by: Ernest Ronal BRAVO, MD     Interpretation: abnormal     ECG rate:  100   ECG rate assessment: tachycardic     Rhythm: sinus tachycardia     Ectopy: PVCs     Conduction: normal   .Critical Care  Performed by: Ernest Ronal BRAVO, MD Authorized by: Ernest Ronal BRAVO, MD   Critical care provider statement:    Critical care time (minutes):  30   Critical care was necessary to treat or prevent imminent or life-threatening deterioration of the following conditions:  Sepsis   Critical care was time spent personally by me on the following activities:  Development of treatment plan with patient or surrogate, discussions with consultants, evaluation of patient's response to treatment, examination of patient, ordering and review of laboratory studies, ordering and review of radiographic studies, ordering and performing  treatments and interventions, pulse oximetry, re-evaluation of patient's condition and review of old charts    MEDICATIONS ORDERED IN ED: Medications  sodium chloride  0.9 % bolus 500 mL (has no administration in time range)  ceFEPIme (MAXIPIME) 2 g in sodium chloride  0.9 % 100 mL IVPB (0 g Intravenous Stopped 05/16/24 1211)  metroNIDAZOLE (FLAGYL) IVPB 500 mg (0 mg Intravenous Stopped 05/16/24 1211)  vancomycin (VANCOCIN) IVPB 1000 mg/200 mL premix (0 mg Intravenous Stopped 05/16/24 1404)  iohexol  (OMNIPAQUE ) 350 MG/ML injection 100 mL (100 mLs Intravenous Contrast Given 05/16/24 1245)     IMPRESSION / MDM / ASSESSMENT AND PLAN / ED COURSE  I reviewed the triage vital signs and the nursing notes.   Patient's presentation is most consistent with acute presentation with potential threat to life or bodily function.  Given febrile, tachycardic met sepsis criteria already given Tylenol  by EMS.  Given patient is a chemotherapy patient started on broad-spectrum antibiotics.  Patient was hypoxic and placed on nonrebreather and I converted this over to 6 L and he was satting 93%.  VBG is without any evidence of hypercapnia.  Given the concern for syncopal episode and hypoxia CT PE ordered as well as CT head evaluate for intercranial hemorrhage, CT cervical evaluate for cervical fracture and CT abdomen pelvis given he is got some redness and warmth noted to his lower abdomen.  Given patient has significant edema in his legs he reports that he supposed be on Lasix and his chest x-ray looks consistent with pulmonary edema unguinal hold off on fluid resuscitation and proceed with CT imaging.  CT imaging was negative for acute findings he does have lung cancer.  Did call back radiology confirmed they did not see any evidence of pulmonary edema or concerns for pneumonia therefore unclear what is causing his new hypoxia unless this is related to his known lung cancer.  He does have some wheezing on reassessment  so I wonder if some of this could be like a COPD exacerbation so I ordered some DuoNebs and Solu-Medrol .  He remains on his 6 L.  He again denies using oxygen at home but does use CPAP at nighttime though patient discussed with patient for admission to the hospital for broad-spectrum antibiotics and further workup.  Patient however states that he would like to be transferred to the [facility] therefore we will try to reach out to the [facility]  for potential transfer  Patient may have off to oncoming team pending potential transfer  The patient is on the cardiac monitor to evaluate for evidence of arrhythmia and/or significant heart rate changes.      FINAL CLINICAL IMPRESSION(S) / ED DIAGNOSES   Final diagnoses:  Sepsis, due to unspecified organism, unspecified whether acute organ dysfunction present (HCC)  Acute respiratory failure with hypoxia (HCC)     Rx / DC Orders   ED Discharge Orders     None        Note:  This document was prepared using Dragon voice recognition software and may include unintentional dictation errors.   Ernest Ronal BRAVO, MD 05/16/24 [phone]

## 2024-05-16 NOTE — ED Provider Notes (Signed)
  Physical Exam  BP 109/68   Pulse 74   Temp 97.8 F (36.6 C) (Axillary)   Resp 13   Ht 5' 9 (1.753 m)   Wt 122 kg   SpO2 92%   BMI 39.72 kg/m   Physical Exam  Procedures  Procedures  ED Course / MDM   Clinical Course as of 05/16/24 1715  Tue May 16, 2024  1506 S/o from Dr. Ernest: 63M hx lung cancer on chemo, followed at [facility] P/w syncope, limited historian Hypoxic to 50s, not on O2 at home Fever w/ EMS, s/p tylenol  S/p broad spectrum abx Viral swab neg CTPE w/ no clear pathology Some wheezing On 6L O2 Getting copd tx Apparent cellulitis on pannus, R leg UA pending Trop somewhat elevated, no chest pain, suspect type II NSTEMI Initial VBG overall reassuring  Needs admission:  TO DO: - Admit, ?[facility] transfer - need bipap [MM]  [facility]42 [facility] unable [facility]  Will admit to [facility] [MM]  1706 Hospitalist consult order placed [MM]    Clinical Course User Index [MM] Clarine Ozell LABOR, MD   Medical Decision Making Amount and/or Complexity of Data Reviewed Labs: ordered. Radiology: ordered.  Risk Prescription drug management. Decision regarding hospitalization.          Clarine Ozell LABOR, MD 05/16/24 [phone]

## 2024-05-16 NOTE — H&P (Signed)
 History and Physical    Patient: Jerome [name] FMW:[id number] DOB: February 22, 1961 DOA: 05/16/2024 DOS: the patient was seen and examined on 05/16/2024 [facility]  Patient coming from: Home  Chief Complaint:  Chief Complaint  Patient presents with   Loss of Consciousness   Shortness of Breath   HPI: Jerome [name] is a 63 y.o. male with medical history significant of COPD, type 2 diabetes, dyslipidemia, hypertension, obstructive sleep apnea on CPAP, aortic stenosis, clotting disorder on lower dose Xarelto who was brought to the hospital with acute respiratory failure.  Patient has no memory of why he was brought to the hospital.  Per EMS report, patient was found on the floor with oxygen saturations of 50% and profoundly Confused, he had a syncope episode while at home.  He currently denies any short of breath or cough.  But he had a low-grade fever. Upon arriving hospital, ABG showed severe CO2 retention, hypoxemia, he was placed on BiPAP.  CT chest with contrast to rule out PE, but still did show a lung mass, with metastatic lesion in the liver. VBG showed pH of 7.44, CO2 of 71, oxygen 62.  proBNP 3068.  Troponin 105. Patient is admitted to the progressive unit for continued treatment.   Review of Systems: As mentioned in the history of present illness. All other systems reviewed and are negative. Past Medical History:  Diagnosis Date   Aortic stenosis    moderate AS by 02/21/2024 TTE ([facility])   Arthritis    Cancer (HCC)    Clotting disorder    COPD (chronic obstructive pulmonary disease) (HCC)    Diabetes mellitus without complication (HCC)    Hyperlipidemia    Hypertension    Oxygen deficiency    Sleep apnea    Past Surgical History:  Procedure Laterality Date   KNEE SURGERY Left    mesentric     Insufficiency of mesentric artery   Social History:  reports that he has been smoking cigarettes. He has never used smokeless tobacco. He reports that  he does not drink alcohol and does not use drugs.  No Known Allergies  History reviewed. No pertinent family history.  Prior to Admission medications   Medication Sig Start Date End Date Taking? Authorizing Provider  acetaminophen  (TYLENOL ) 325 MG tablet Take 650 mg by mouth every 6 (six) hours as needed for mild pain or moderate pain. Patient not taking: Reported on 05/12/2024    [provider]  albuterol (PROVENTIL HFA;VENTOLIN HFA) 108 (90 Base) MCG/ACT inhaler Inhale into the lungs.    [provider]  aspirin EC 81 MG tablet Take 81 mg by mouth daily. Swallow whole. Patient not taking: Reported on 05/12/2024    [provider]  atorvastatin (LIPITOR) 80 MG tablet Take 80 mg by mouth daily.    [provider]  clopidogrel (PLAVIX) 75 MG tablet Take 75 mg by mouth daily.    [provider]  colchicine 0.6 MG tablet Take 0.6 mg by mouth 2 (two) times daily. Patient not taking: Reported on 05/12/2024 02/17/23   [provider]  Empagliflozin-metFORMIN HCl 12.10-998 MG TABS Take 2 tablets by mouth every morning.    [provider]  famotidine (PEPCID) 20 MG tablet Take 20 mg by mouth daily. Patient not taking: Reported on 05/12/2024 02/17/23   [provider]  gabapentin (NEURONTIN) 800 MG tablet Take 800 mg by mouth 3 (three) times daily as needed (back and leg pain). Patient not taking:  Reported on 05/12/2024    [provider]  glipiZIDE (GLUCOTROL) 10 MG tablet Take 10 mg by mouth daily before breakfast. Patient not taking: Reported on 05/12/2024    [provider]  insulin aspart (NOVOLOG) 100 UNIT/ML injection Inject 5 Units into the skin 3 (three) times daily before meals.    [provider]  insulin glargine (LANTUS) 100 UNIT/ML injection Inject 30 Units into the skin daily.    [provider]  lidocaine (LIDODERM) 5 % Place 1 patch onto the skin daily. Patient not taking:  Reported on 05/12/2024 02/05/23   [provider]  lisinopril (PRINIVIL,ZESTRIL) 40 MG tablet Take 40 mg by mouth daily. Patient not taking: Reported on 05/12/2024    [provider]  methadone (DOLOPHINE) 5 MG tablet Take 7.5 mg by mouth 3 (three) times daily.    [provider]  omeprazole (PRILOSEC) 40 MG capsule Take 40 mg by mouth daily. Patient not taking: Reported on 05/12/2024    [provider]  oxyCODONE  (ROXICODONE ) 15 MG immediate release tablet Take 15 mg by mouth every 4 (four) hours as needed for pain.    [provider]  pregabalin (LYRICA) 75 MG capsule Take 75 mg by mouth daily. Patient has not started medication at this time    [provider]  prochlorperazine (COMPAZINE) 5 MG tablet Take 5 mg by mouth every 8 (eight) hours as needed for nausea. Patient not taking: Reported on 05/12/2024 02/17/23   [provider]  rivaroxaban (XARELTO) 10 MG TABS tablet Take 10 mg by mouth daily.    [provider]  senna (SENOKOT) 8.6 MG TABS tablet Take 1 tablet by mouth.    [provider]  Tiotropium Bromide-Olodaterol 2.5-2.5 MCG/ACT AERS Inhale 2 puffs into the lungs daily at 12 noon. 10/05/22   [provider]  torsemide (DEMADEX) 20 MG tablet Take 20 mg by mouth daily.    [provider]  varenicline (CHANTIX) 1 MG tablet Take 1 mg by mouth 2 (two) times daily. Patient not taking: Reported on 05/12/2024 01/11/23   [provider]  zolpidem (AMBIEN) 10 MG tablet Take 10 mg by mouth at bedtime as needed for sleep (maximum of 3-5 tablet per week).    [provider]    Physical Exam: Vitals:   05/16/24 1030 05/16/24 1240 05/16/24 1410 05/16/24 1500  BP: (!) 117/52   109/68  Pulse: (!) 109 88  74  Resp: 18   13  Temp:   97.8 F (36.6 C)   TempSrc:   Axillary   SpO2: 94% 95%  92%  Weight:      Height:       Physical Exam Constitutional:      General: He is not in  acute distress.    Appearance: He is not toxic-appearing.     Comments: Morbid obese  HENT:     Head: Normocephalic and atraumatic.     Mouth/Throat:     Mouth: Mucous membranes are moist.  Eyes:     Extraocular Movements: Extraocular movements intact.     Pupils: Pupils are equal, round, and reactive to light.  Neck:     Thyroid: No thyromegaly.     Vascular: JVD present.  Cardiovascular:     Rate and Rhythm: Regular rhythm. Tachycardia present. No extrasystoles are present.    Heart sounds: No murmur heard. Pulmonary:     Effort: Pulmonary effort is normal.     Comments: Significant decreased breath  sounds without crackles or wheezes. Abdominal:     General: Bowel sounds are normal.     Palpations: Abdomen is soft. There is no hepatomegaly or mass.     Tenderness: There is no abdominal tenderness.     Comments: Abdominal obesity with some distention.  Musculoskeletal:     Cervical back: Normal range of motion.     Right lower leg: Edema present.     Left lower leg: Edema present.  Lymphadenopathy:     Cervical: No cervical adenopathy.  Skin:    General: Skin is warm.     Coloration: Skin is not cyanotic.  Neurological:     General: No focal deficit present.     Mental Status: He is alert and oriented to person, place, and time.     Cranial Nerves: No cranial nerve deficit.  Psychiatric:        Mood and Affect: Mood normal.        Behavior: Behavior normal.     Data Reviewed:  Chest CT and lab results as above.  Assessment and Plan: Acute respiratory failure with hypoxemia and hypercapnia. COPD exacerbation. Syncope and collapse secondary to acute respiratory failure. Patient developed significant hypoxia and hypercapnia.  This is multifactorial, secondary to COPD exacerbation, acute on chronic congestive heart failure, sleep apnea. Patient has received IV steroids, will continue oral steroids.  He will be continued on BiPAP tonight.  He will need BiPAP at  discharge. I will also start on antibiotics with Rocephin  and azithromycin max for now. Syncope appears to be secondary to acute respite failure with hypoxemia and hypercapnia.  Will continue telemetry monitoring.  Acute congestive heart failure likely diastolic. Aortic stenosis. Anasarca. Elevated troponin secondary to acute respiratory failure. Patient has significant leg edema, and significant edema around the thigh, this is most likely secondary to congestive heart failure.  Reviewed the chart, patient never had a echocardiogram in record, presumed to be diastolic.  Will obtain echocardiogram for now.  He also has aortic stenosis is listed as a diagnosis history. Also started IV Lasix at 60 mg twice a day, monitor electrolytes and renal function.  Obstruct sleep apnea. Class III obesity with BMI close to 40. Patient was on CPAP at home, probably will need BiPAP at discharge. Diet and excise advised.  Uncontrolled type 2 diabetes with hyperglycemia. Patient was taking Lantus 30 units at home, will start Lantus at 15 units now, added sliding scale insulin.  Will need to adjust dose as needed.  Lung mass with liver metastasis. This is a new finding, may consider oncology consult tomorrow.  Prophylaxis. Patient was on lower dose Xarelto, presumably for clotting disorder.    Advance Care Planning:   Code Status: Full Code Pt wished to be full code.  Consults: None  Family Communication: None  Severity of Illness: The appropriate patient status for this patient is INPATIENT. Inpatient status is judged to be reasonable and necessary in order to provide the required intensity of service to ensure the patient's safety. The patient's presenting symptoms, physical exam findings, and initial radiographic and laboratory data in the context of their chronic comorbidities is felt to place them at high risk for further clinical deterioration. Furthermore, it is not anticipated that the patient  will be medically stable for discharge from the hospital within 2 midnights of admission.   * I certify that at the point of admission it is my clinical judgment that the patient will require inpatient hospital care spanning beyond 2 midnights  from the point of admission due to high intensity of service, high risk for further deterioration and high frequency of surveillance required.*  Author: Murvin Mana, MD 05/16/2024 5:43 PM  For on call review [email or web address].

## 2024-05-17 ENCOUNTER — Other Ambulatory Visit (HOSPITAL_COMMUNITY): Payer: Self-pay

## 2024-05-17 DIAGNOSIS — J441 Chronic obstructive pulmonary disease with (acute) exacerbation: Secondary | ICD-10-CM

## 2024-05-17 DIAGNOSIS — G4733 Obstructive sleep apnea (adult) (pediatric): Secondary | ICD-10-CM

## 2024-05-17 DIAGNOSIS — G894 Chronic pain syndrome: Secondary | ICD-10-CM

## 2024-05-17 DIAGNOSIS — E66813 Obesity, class 3: Secondary | ICD-10-CM

## 2024-05-17 DIAGNOSIS — J9601 Acute respiratory failure with hypoxia: Secondary | ICD-10-CM

## 2024-05-17 LAB — RESPIRATORY PANEL BY PCR

## 2024-05-17 LAB — GLUCOSE, CAPILLARY
Glucose-Capillary: 208 mg/dL — ABNORMAL HIGH (ref 70–99)
Glucose-Capillary: 236 mg/dL — ABNORMAL HIGH (ref 70–99)
Glucose-Capillary: 253 mg/dL — ABNORMAL HIGH (ref 70–99)
Glucose-Capillary: 258 mg/dL — ABNORMAL HIGH (ref 70–99)
Glucose-Capillary: 272 mg/dL — ABNORMAL HIGH (ref 70–99)

## 2024-05-17 LAB — ECHOCARDIOGRAM COMPLETE
AR max vel: 1.11 cm2
AV Area VTI: 1.12 cm2
AV Area mean vel: 1.08 cm2
AV Mean grad: 11.8 mmHg
AV Peak grad: 21.3 mmHg
Ao pk vel: 2.31 m/s
Area-P 1/2: 4.07 cm2
Height: 69 in
S' Lateral: 3.8 cm
Weight: 4303.38 [oz_av]

## 2024-05-17 LAB — BASIC METABOLIC PANEL WITH GFR
Anion gap: 7 (ref 5–15)
BUN: 17 mg/dL (ref 8–23)
CO2: 36 mmol/L — ABNORMAL HIGH (ref 22–32)
Calcium: 8.5 mg/dL — ABNORMAL LOW (ref 8.9–10.3)
Chloride: 92 mmol/L — ABNORMAL LOW (ref 98–111)
Creatinine, Ser: 0.48 mg/dL — ABNORMAL LOW (ref 0.61–1.24)
GFR, Estimated: >60 mL/min (ref >60)
Glucose, Bld: 244 mg/dL — ABNORMAL HIGH (ref 70–99)
Potassium: 3.4 mmol/L — ABNORMAL LOW (ref 3.5–5.1)
Sodium: 135 mmol/L (ref 135–145)

## 2024-05-17 LAB — HEMOGLOBIN A1C
Hgb A1c MFr Bld: 7.5 % — ABNORMAL HIGH (ref 4.8–5.6)
Mean Plasma Glucose: 169 mg/dL

## 2024-05-17 LAB — MAGNESIUM: Magnesium: 1.9 mg/dL (ref 1.7–2.4)

## 2024-05-17 LAB — TROPONIN T, HIGH SENSITIVITY: Troponin T High Sensitivity: 74 ng/L — ABNORMAL HIGH (ref 0–19)

## 2024-05-17 LAB — HIV ANTIBODY (ROUTINE TESTING W REFLEX): HIV Screen 4th Generation wRfx: NONREACTIVE

## 2024-05-17 MED ORDER — POTASSIUM CHLORIDE 20 MEQ PO PACK
40.0000 meq | PACK | Freq: Once | ORAL | Status: AC
  Administered 2024-05-17: 40 meq via ORAL
  Filled 2024-05-17: qty 2

## 2024-05-17 MED ORDER — OXYCODONE HCL 5 MG PO TABS
15.0000 mg | ORAL_TABLET | ORAL | Status: DC | PRN
  Administered 2024-05-17 – 2024-05-18 (×4): 15 mg via ORAL
  Filled 2024-05-17 (×4): qty 3

## 2024-05-17 MED ORDER — MORPHINE SULFATE (PF) 2 MG/ML IV SOLN
2.0000 mg | INTRAVENOUS | Status: DC | PRN
  Administered 2024-05-17 (×5): 2 mg via INTRAVENOUS
  Filled 2024-05-17 (×5): qty 1

## 2024-05-17 MED ORDER — MORPHINE SULFATE (PF) 2 MG/ML IV SOLN
2.0000 mg | INTRAVENOUS | Status: DC | PRN
  Administered 2024-05-17: 2 mg via INTRAVENOUS

## 2024-05-17 MED ORDER — MORPHINE SULFATE (PF) 2 MG/ML IV SOLN
2.0000 mg | INTRAVENOUS | Status: DC | PRN
  Filled 2024-05-17: qty 1

## 2024-05-17 MED ORDER — MAGNESIUM SULFATE IN D5W 1-5 GM/100ML-% IV SOLN
1.0000 g | Freq: Once | INTRAVENOUS | Status: AC
  Administered 2024-05-17: 1 g via INTRAVENOUS
  Filled 2024-05-17: qty 100

## 2024-05-17 MED ORDER — MORPHINE SULFATE (PF) 2 MG/ML IV SOLN
2.0000 mg | INTRAVENOUS | Status: DC | PRN
  Administered 2024-05-17 – 2024-05-20 (×21): 2 mg via INTRAVENOUS
  Filled 2024-05-17 (×21): qty 1

## 2024-05-17 MED ORDER — IPRATROPIUM-ALBUTEROL 0.5-2.5 (3) MG/3ML IN SOLN
3.0000 mL | Freq: Two times a day (BID) | RESPIRATORY_TRACT | Status: DC
  Administered 2024-05-17 – 2024-05-21 (×7): 3 mL via RESPIRATORY_TRACT
  Filled 2024-05-17 (×7): qty 3

## 2024-05-17 MED ORDER — OXYCODONE HCL 5 MG PO TABS
20.0000 mg | ORAL_TABLET | Freq: Four times a day (QID) | ORAL | Status: DC | PRN
  Filled 2024-05-17: qty 4

## 2024-05-17 NOTE — Progress Notes (Signed)
 Notified Dr. Jens that patient c/o pain 10/10 and that unable to give PRN morphine per PRN schedule since it was changed to q4H. Patient aware of his PRN pain regimen as ordered. RN also notified Dr. Jens via secure chat still c/o hand cramps to the extent he can't pick up utensil to eat or hold a cup and notified MD that patient would like to see him at bedside and MD stated he would come later. RN sent secure chat to Crystal from palliative care to ask who would be seeing patient.

## 2024-05-17 NOTE — Progress Notes (Cosign Needed)
 Wasted 2.5 mg tablet oral methadone with Lauraine Ponto, RN. Wasted in immunologist.

## 2024-05-17 NOTE — Progress Notes (Signed)
 Wasted 2.5 mg tablet oral methadone with Zane Capes, RN. Wasted in immunologist.

## 2024-05-17 NOTE — Progress Notes (Signed)
 PT'S BIPAP MACHINE CHECKED BY RT.

## 2024-05-17 NOTE — Inpatient Diabetes Management (Signed)
 Inpatient Diabetes Program Recommendations  AACE/ADA: New Consensus Statement on Inpatient Glycemic Control   Target Ranges:  Prepandial:   less than 140 mg/dL      Peak postprandial:   less than 180 mg/dL (1-2 hours)      Critically ill patients:  140 - 180 mg/dL    Latest Reference Range & Units 05/16/24 18:18 05/16/24 22:16 05/17/24 00:09 05/17/24 08:19 05/17/24 12:10  Glucose-Capillary 70 - 99 mg/dL 815 (H) 740 (H) 763 (H) 208 (H) 253 (H)    Review of Glycemic Control  Diabetes history: DM2 Outpatient Diabetes medications: Lantus 30  units daily, Novolog 5 units TID with meals, Glipizide 10 mg QAM (not taking), empagliflozin-metformin 25-2000 mg daily Current orders for Inpatient glycemic control: Semglee 15 units at bedtime, Novolog 0-9 units TID with meals, Novolog 0-5 units at bedtime; Prednisone  40 mg QAM  Inpatient Diabetes Program Recommendations:    Insulin: Patient received Solumedrol 125 mg x1 on 12/2 and now ordered Prednisone  40 mg daily (steroids contributing to hyperglycemia). If steroids are continued as ordered, please consider increasing Semglee to 20 units at bedtime and adding Novolog 4 units TID with meals for meal coverage if patient eats at least 50% of meals.  Thanks, Earnie Gainer, RN, MSN, CDCES Diabetes Coordinator Inpatient Diabetes Program [phone] (Team Pager from 8am to 5pm)

## 2024-05-17 NOTE — Plan of Care (Signed)

## 2024-05-17 NOTE — TOC CM/SW Note (Signed)
 Transition of Care [facility]) CM/SW Note   Transition of Care [facility]) - Inpatient Brief Assessment   Patient Details  Name: Jerome [name] MRN: [id number] Date of Birth: 1961-04-13  Transition of [facility]) CM/SW Contact:    Alfonso Rummer, LCSW Phone Number: 05/17/2024, 9:59 AM   Clinical Narrative: Completed TOC chart review. TOC will follow pt to ensure safe discharge planning.    Transition of Care Asessment: Insurance and Status: Insurance coverage has been reviewed Patient has primary care physician: Yes [facility]) Home environment has been reviewed: single family home     Social Drivers of Health Review: SDOH reviewed needs interventions Readmission risk has been reviewed: No Transition of care needs: transition of care needs identified, TOC will continue to follow

## 2024-05-17 NOTE — Progress Notes (Addendum)
 Progress Note    Jerome [name]  FMW:[id number] DOB: 07/04/1960  DOA: 05/16/2024 PCP: [facility]      Brief Narrative:    Medical records reviewed and are as summarized below:  JACSON RAPAPORT is a 63 y.o. 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  Reportedly, oxygen saturation was in the 50s on room air when EMS arrived.  He was placed on 15 L oxygen via nonrebreather mask and brought to the ED for further management.   Initial vital signs in the ED: Temperature 100.3 F, respiratory rate 18, pulse 109, BP 117/52, oxygen saturation 94% on 6 L oxygen.  proBNP 3,068 Venous blood gas showed pH 7.44, pCO2 62, pO2 97.   Chest x-ray IMPRESSION: 1. Possible bibasilar atelectasis or pneumonia and small right pleural effusion. Recommend PA and lateral views with a better inspiration when possible. 2. Interval cardiomegaly and pulmonary vascular congestion, accentuated by a poor inspiration. 3. Stable mild chronic interstitial lung disease and mild chronic bronchitic changes.   CTA chest IMPRESSION: 1. No evidence of pulmonary embolism. 2. 4.2 x 5.6 cm mass abutting the pleura over the right lower lobe laterally with adjacent 2.2 x 2.3 cm nodular density over the posterolateral right upper lobe adjacent the major fissure. 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 Recommend referral to [facility]) for consideration of tissue sampling. 3. 2.5 cm hypodensity over the dome of the right lobe of the liver which appears to be continuous with the right lower lobe lung mass likely metastatic disease due to local invasion. 4. Mild-to-moderate  cardiomegaly. Atherosclerotic coronary artery disease. 5. Aortic atherosclerosis. 6. No acute findings in the abdomen/pelvis.   Aortic Atherosclerosis (ICD10-I70.0).       Assessment/Plan:   Principal Problem:   Acute hypoxemic respiratory failure (HCC) Active Problems:   Chronic pain syndrome   Class 3 obesity (HCC)   COPD with acute exacerbation (HCC)   OSA (obstructive sleep apnea)    Body mass index is 39.72 kg/m.  (Class II obesity)  S/p syncope: Probably this was due to acute respiratory failure.   Acute hypoxic and hypercapnic respiratory failure: Oxygen has been weaned down from 7 to 3 L/min.  Wean off oxygen as able.  He was previously on BiPAP. Suspect he may have underlying chronic hypoxic and hypercapnic respiratory failure   COPD exacerbation: Continue steroids and bronchodilators.  Continue empiric IV antibiotics.   Acute on chronic diastolic CHF, anasarca: Continue IV Lasix.  Monitor BMP, daily weights and urine output. ?  History of aortic stenosis. 2D echo is pending.   Elevated troponins: Troponin trend 105, 97 and 74.  This is likely due to demand ischemia   Hypokalemia: Replete potassium and monitor levels   Acute metabolic encephalopathy: Mental status has improved.   Known non-small cell right lung cancer, chronic right-sided chest pain: New lesion in the liver concerning for metastatic disease. His oncologist is at the [facility] health system.  Chart review shows that he is on pembrolizumab and last dose was on 05/04/2024. He said he was supposed to have an MRI for further evaluation of malignancy but he needs sedation before MRI can be done. He said he was told he would  need nerve blockers but the MRI has to be done first.  He has been followed by palliative care team at the [facility] system.  He is contemplating hospice.  Continue analgesics for pain.  (Methadone, oxycodone  as needed, IV morphine as needed) Consulted inpatient palliative care  team   He is on low-dose Xarelto for a clotting disorder   Type II DM with hyperglycemia: Continue Semglee 15 units nightly.  Use NovoLog as needed for hyperglycemia.    Comorbidities include obesity, OSA on CPAP, tobacco use disorder, history of abdominal stents/mesenteric ischemia on Plavix    Diet Order             Diet Heart Room service appropriate? Yes; Fluid consistency: Thin  Diet effective now                                  Consultants: Palliative care  Procedures: None    Medications:    arformoterol  15 mcg Nebulization BID   And   umeclidinium bromide  1 puff Inhalation Daily   atorvastatin  80 mg Oral Daily   azithromycin  500 mg Oral Daily   clopidogrel  75 mg Oral Daily   furosemide  60 mg Intravenous Q12H   insulin aspart  0-5 Units Subcutaneous QHS   insulin aspart  0-9 Units Subcutaneous TID WC   insulin glargine-yfgn  15 Units Subcutaneous QHS   ipratropium-albuterol  3 mL Nebulization Q6H   methadone  7.5 mg Oral Q8H   predniSONE   40 mg Oral Q breakfast   rivaroxaban  10 mg Oral Daily   Continuous Infusions:  cefTRIAXone  (ROCEPHIN )  IV Stopped (05/16/24 1916)     Anti-infectives (From admission, onward)    Start     Dose/Rate Route Frequency Ordered Stop   05/16/24 1800  cefTRIAXone  (ROCEPHIN ) 1 g in sodium chloride  0.9 % 100 mL IVPB        1 g 200 mL/hr over 30 Minutes Intravenous Every 24 hours 05/16/24 1742     05/16/24 1745  azithromycin (ZITHROMAX) tablet 500 mg        500 mg Oral Daily 05/16/24 1742     05/16/24 1030  ceFEPIme (MAXIPIME) 2 g in sodium chloride  0.9 % 100 mL IVPB        2 g 200 mL/hr over 30 Minutes Intravenous  Once 05/16/24 1026 05/16/24 1211   05/16/24 1030  metroNIDAZOLE (FLAGYL) IVPB 500 mg        500 mg 100 mL/hr over 60 Minutes Intravenous  Once 05/16/24 1026 05/16/24 1211   05/16/24 1030  vancomycin (VANCOCIN) IVPB 1000 mg/200 mL premix        1,000 mg 200 mL/hr over 60 Minutes  Intravenous  Once 05/16/24 1026 05/16/24 1404              Family Communication/Anticipated D/C date and plan/Code Status   DVT prophylaxis: rivaroxaban (XARELTO) tablet 10 mg Start: 05/17/24 1000 SCDs Start: 05/16/24 1735 rivaroxaban (XARELTO) tablet 10 mg     Code Status: Full Code  Family Communication: None Disposition Plan: Plan to discharge home   Status is: Inpatient Remains inpatient appropriate because: Lung cancer, CHF exacerbation, COPD exacerbation and respiratory failure       Subjective:   Interval events noted.  He complains of severe right-sided chest pain from lung cancer.  He said he has been on chemotherapy and he gets his treatment at the  [facility]  Objective:    Vitals:   05/17/24 0404 05/17/24 0816 05/17/24 1137 05/17/24 1209  BP: (!) 103/58 122/66  113/63  Pulse: 84 84  84  Resp: 18 15 20 20   Temp: 98.6 F (37 C) 97.9 F (36.6 C)  98.1 F (36.7 C)  TempSrc:  Oral  Oral  SpO2: 94% 92% 91% 92%  Weight:      Height:       No data found.   Intake/Output Summary (Last 24 hours) at 05/17/2024 1231 Last data filed at 05/17/2024 1023 Gross per 24 hour  Intake 520 ml  Output 1050 ml  Net -530 ml   Filed Weights   05/16/24 1007  Weight: 122 kg    Exam:  GEN: NAD SKIN: Warm and dry EYES: No pallor or icterus ENT: MMM CV: RRR PULM: CTA B, right lower chest wall tenderness ABD: soft, obese, edematous lower abdominal, NT, +BS CNS: AAO x 3, non focal EXT: Bilateral lower extremity edema from the thighs to the feet.  No erythema or tenderness        Data Reviewed:   I have personally reviewed following labs and imaging studies:  Labs: Labs show the following:   Basic Metabolic Panel: Recent Labs  Lab 05/16/24 1012 05/17/24 0207  NA 138 135  K 4.0 3.4*  CL 94* 92*  CO2 36* 36*  GLUCOSE 159* 244*  BUN 19 17  CREATININE 0.56* 0.48*  CALCIUM  8.7* 8.5*  MG  --  1.9   GFR Estimated Creatinine Clearance: 123.5  mL/min (A) (by C-G formula based on SCr of 0.48 mg/dL (L)). Liver Function Tests: Recent Labs  Lab 05/16/24 1012  AST 55*  ALT 22  ALKPHOS 99  BILITOT 0.7  PROT 6.8  ALBUMIN 2.6*   No results for input(s): LIPASE, AMYLASE in the last 168 hours. No results for input(s): AMMONIA in the last 168 hours. Coagulation profile Recent Labs  Lab 05/16/24 1012  INR 1.2    CBC: Recent Labs  Lab 05/16/24 1012  WBC 12.5*  NEUTROABS 10.7*  HGB 14.2  HCT 47.2  MCV 85.0  PLT 274   Cardiac Enzymes: No results for input(s): CKTOTAL, CKMB, CKMBINDEX, TROPONINI in the last 168 hours. BNP (last 3 results) Recent Labs    05/16/24 1400  PROBNP 3,068.0*   CBG: Recent Labs  Lab 05/16/24 1818 05/16/24 2216 05/17/24 0009 05/17/24 0819 05/17/24 1210  GLUCAP 184* 259* 236* 208* 253*   D-Dimer: No results for input(s): DDIMER in the last 72 hours. Hgb A1c: No results for input(s): HGBA1C in the last 72 hours. Lipid Profile: No results for input(s): CHOL, HDL, LDLCALC, TRIG, CHOLHDL, LDLDIRECT in the last 72 hours. Thyroid function studies: No results for input(s): TSH, T4TOTAL, T3FREE, THYROIDAB in the last 72 hours.  Invalid input(s): FREET3 Anemia work up: No results for input(s): VITAMINB12, FOLATE, FERRITIN, TIBC, IRON, RETICCTPCT in the last 72 hours. Sepsis Labs: Recent Labs  Lab 05/16/24 1012 05/16/24 1400  WBC 12.5*  --   LATICACIDVEN 1.9 1.1    Microbiology Recent Results (from the past 240 hours)  Culture, blood (Routine x 2)     Status: None (Preliminary result)   Collection Time: 05/16/24 10:14 AM   Specimen: BLOOD  Result Value Ref Range Status   Specimen Description BLOOD BLOOD RIGHT ARM  Final   Special Requests   Final    BOTTLES DRAWN AEROBIC AND ANAEROBIC Blood Culture results may not be optimal due to  an inadequate volume of blood received in culture bottles   Culture   Final    NO GROWTH < 24  HOURS Performed at [facility], [address] [place], [place] [address]    Report Status PENDING  Incomplete  Resp panel by RT-PCR (RSV, Flu A&B, Covid) Anterior Nasal Swab     Status: None   Collection Time: 05/16/24 10:19 AM   Specimen: Anterior Nasal Swab  Result Value Ref Range Status   SARS Coronavirus 2 by RT PCR NEGATIVE NEGATIVE Final    Comment: (NOTE) SARS-CoV-2 target nucleic acids are NOT DETECTED.  The SARS-CoV-2 RNA is generally detectable in upper respiratory specimens during the acute phase of infection. The lowest concentration of SARS-CoV-2 viral copies this assay can detect is 138 copies/mL. A negative result does not preclude SARS-Cov-2 infection and should not be used as the sole basis for treatment or other patient management decisions. A negative result may occur with  improper specimen collection/handling, submission of specimen other than nasopharyngeal swab, presence of viral mutation(s) within the areas targeted by this assay, and inadequate number of viral copies(<138 copies/mL). A negative result must be combined with clinical observations, patient history, and epidemiological information. The expected result is Negative.  Fact Sheet for Patients:  bloggercourse.com  Fact Sheet for Healthcare Providers:  seriousbroker.it  This test is no t yet approved or cleared by the [place]  FDA and  has been authorized for detection and/or diagnosis of SARS-CoV-2 by FDA under an Emergency Use Authorization (EUA). This EUA will remain  in effect (meaning this test can be used) for the duration of the COVID-19 declaration under Section 564(b)(1) of the Act, 21 U.S.C.section 360bbb-3(b)(1), unless the authorization is terminated  or revoked sooner.       Influenza A by PCR NEGATIVE NEGATIVE Final   Influenza B by PCR NEGATIVE NEGATIVE Final    Comment: (NOTE) The Xpert Xpress  SARS-CoV-2/FLU/RSV plus assay is intended as an aid in the diagnosis of influenza from Nasopharyngeal swab specimens and should not be used as a sole basis for treatment. Nasal washings and aspirates are unacceptable for Xpert Xpress SARS-CoV-2/FLU/RSV testing.  Fact Sheet for Patients: bloggercourse.com  Fact Sheet for Healthcare Providers: seriousbroker.it  This test is not yet approved or cleared by the [place]  FDA and has been authorized for detection and/or diagnosis of SARS-CoV-2 by FDA under an Emergency Use Authorization (EUA). This EUA will remain in effect (meaning this test can be used) for the duration of the COVID-19 declaration under Section 564(b)(1) of the Act, 21 U.S.C. section 360bbb-3(b)(1), unless the authorization is terminated or revoked.     Resp Syncytial Virus by PCR NEGATIVE NEGATIVE Final    Comment: (NOTE) Fact Sheet for Patients: bloggercourse.com  Fact Sheet for Healthcare Providers: seriousbroker.it  This test is not yet approved or cleared by the [place]  FDA and has been authorized for detection and/or diagnosis of SARS-CoV-2 by FDA under an Emergency Use Authorization (EUA). This EUA will remain in effect (meaning this test can be used) for the duration of the COVID-19 declaration under Section 564(b)(1) of the Act, 21 U.S.C. section 360bbb-3(b)(1), unless the authorization is terminated or revoked.  Performed at [facility], [address] [place], [place] [address]   Respiratory (~20 pathogens) panel by PCR     Status: None   Collection Time: 05/16/24  8:16 PM   Specimen: Nasopharyngeal Swab; Respiratory  Result Value Ref Range Status   Adenovirus NOT DETECTED  NOT DETECTED Final   Coronavirus 229E NOT DETECTED NOT DETECTED Final    Comment: (NOTE) The Coronavirus on the Respiratory Panel, DOES NOT test for the novel   Coronavirus (2019 nCoV)    Coronavirus HKU1 NOT DETECTED NOT DETECTED Final   Coronavirus NL63 NOT DETECTED NOT DETECTED Final   Coronavirus OC43 NOT DETECTED NOT DETECTED Final   Metapneumovirus NOT DETECTED NOT DETECTED Final   Rhinovirus / Enterovirus NOT DETECTED NOT DETECTED Final   Influenza A NOT DETECTED NOT DETECTED Final   Influenza B NOT DETECTED NOT DETECTED Final   Parainfluenza Virus 1 NOT DETECTED NOT DETECTED Final   Parainfluenza Virus 2 NOT DETECTED NOT DETECTED Final   Parainfluenza Virus 3 NOT DETECTED NOT DETECTED Final   Parainfluenza Virus 4 NOT DETECTED NOT DETECTED Final   Respiratory Syncytial Virus NOT DETECTED NOT DETECTED Final   Bordetella pertussis NOT DETECTED NOT DETECTED Final   Bordetella Parapertussis NOT DETECTED NOT DETECTED Final   Chlamydophila pneumoniae NOT DETECTED NOT DETECTED Final   Mycoplasma pneumoniae NOT DETECTED NOT DETECTED Final    Comment: Performed at [facility], [address] [place], [place] [address]  Culture, blood (Routine X 2) w Reflex to ID Panel     Status: None (Preliminary result)   Collection Time: 05/17/24  2:07 AM   Specimen: BLOOD  Result Value Ref Range Status   Specimen Description BLOOD RIGHT ANTECUBITAL  Final   Special Requests   Final    BOTTLES DRAWN AEROBIC AND ANAEROBIC Blood Culture adequate volume   Culture   Final    NO GROWTH < 12 HOURS Performed at [facility], [address], [place], [place] [address]    Report Status PENDING  Incomplete    Procedures and diagnostic studies:  ECHOCARDIOGRAM COMPLETE Result Date: 05/17/2024    ECHOCARDIOGRAM REPORT   Patient Name:   FRANCO DULEY Date of Exam: 05/16/2024 Medical Rec #:  [id number]        Height:       69.0 in Accession #:    [id number]       Weight:       269.0 lb Date of Birth:  Oct 30, 1960        BSA:          2.343 m Patient Age:    63 years         BP:           103/54 mmHg Patient Gender: M                HR:            80 bpm. Exam Location:  [facility] Procedure: 2D Echo, Cardiac Doppler and Color Doppler (Both Spectral and Color            Flow Doppler were utilized during procedure). Indications:     I50.31 Acute Diastolic CHF  History:         Patient has no prior history of Echocardiogram examinations.                  COPD; Risk Factors:Hypertension, Diabetes, Dyslipidemia and                  Sleep Apnea.  Sonographer:     Carl Coma RDCS Referring Phys:  [id number] MURVIN MANA Diagnosing Phys: Evalene Lunger MD  Sonographer Comments: Technically difficult study due to poor echo windows. IMPRESSIONS  1. Left ventricular ejection fraction, by estimation, is  50 to 55%. The left ventricle has low normal function. The left ventricle has no regional wall motion abnormalities. Left ventricular diastolic parameters are consistent with Grade I diastolic dysfunction (impaired relaxation).  2. Right ventricular systolic function is mildly reduced. The right ventricular size is mildly enlarged. There is normal pulmonary artery systolic pressure. The estimated right ventricular systolic pressure is 32.7 mmHg.  3. The mitral valve is normal in structure. No evidence of mitral valve regurgitation. No evidence of mitral stenosis.  4. The aortic valve is calcified. Aortic valve regurgitation is not visualized. Mild aortic valve stenosis. Aortic valve area, by VTI measures 1.12 cm. Aortic valve mean gradient measures 11.8 mmHg. Aortic valve Vmax measures 2.31 m/s.  5. The inferior vena cava is normal in size with <50% respiratory variability, suggesting right atrial pressure of 8 mmHg. FINDINGS  Left Ventricle: Left ventricular ejection fraction, by estimation, is 50 to 55%. The left ventricle has low normal function. The left ventricle has no regional wall motion abnormalities. Strain was performed and the global longitudinal strain is indeterminate. The left ventricular internal cavity size was normal in size. There is no left  ventricular hypertrophy. Left ventricular diastolic parameters are consistent with Grade I diastolic dysfunction (impaired relaxation). Right Ventricle: The right ventricular size is mildly enlarged. No increase in right ventricular wall thickness. Right ventricular systolic function is mildly reduced. There is normal pulmonary artery systolic pressure. The tricuspid regurgitant velocity  is 2.63 m/s, and with an assumed right atrial pressure of 5 mmHg, the estimated right ventricular systolic pressure is 32.7 mmHg. Left Atrium: Left atrial size was normal in size. Right Atrium: Right atrial size was normal in size. Pericardium: Trivial pericardial effusion is present. Mitral Valve: The mitral valve is normal in structure. No evidence of mitral valve regurgitation. No evidence of mitral valve stenosis. Tricuspid Valve: The tricuspid valve is normal in structure. Tricuspid valve regurgitation is mild . No evidence of tricuspid stenosis. The aortic valve is calcified. Aortic valve regurgitation is not visualized. Mild aortic stenosis is present. Pulmonic Valve: The pulmonic valve was normal in structure. Pulmonic valve regurgitation is not visualized. No evidence of pulmonic stenosis. Aorta: The aortic root is normal in size and structure. Venous: The inferior vena cava is normal in size with less than 50% respiratory variability, suggesting right atrial pressure of 8 mmHg. IAS/Shunts: No atrial level shunt detected by color flow Doppler. Additional Comments: 3D was performed not requiring image post processing on an independent workstation and was indeterminate.  LEFT VENTRICLE PLAX 2D LVIDd:         5.30 cm   Diastology LVIDs:         3.80 cm   LV e' medial:    8.78 cm/s LV PW:         0.90 cm   LV E/e' medial:  11.3 LV IVS:        0.70 cm   LV e' lateral:   9.89 cm/s LVOT diam:     2.10 cm   LV E/e' lateral: 10.0 LV SV:         52 LV SV Index:   22 LVOT Area:     3.46 cm  RIGHT VENTRICLE             IVC RV Basal  diam:  4.00 cm     IVC diam: 2.80 cm RV S prime:     11.02 cm/s TAPSE (M-mode): 2.3 cm LEFT ATRIUM  Index        RIGHT ATRIUM           Index LA diam:        5.30 cm 2.26 cm/m   RA Area:     19.10 cm LA Vol (A2C):   84.5 ml 36.06 ml/m  RA Volume:   60.00 ml  25.61 ml/m LA Vol (A4C):   40.7 ml 17.37 ml/m LA Biplane Vol: 62.3 ml 26.59 ml/m  AORTIC VALVE AV Area (Vmax):    1.11 cm AV Area (Vmean):   1.08 cm AV Area (VTI):     1.12 cm AV Vmax:           231.00 cm/s AV Vmean:          159.200 cm/s AV VTI:            0.462 m AV Peak Grad:      21.3 mmHg AV Mean Grad:      11.8 mmHg LVOT Vmax:         74.15 cm/s LVOT Vmean:        49.500 cm/s LVOT VTI:          0.150 m LVOT/AV VTI ratio: 0.32  AORTA Ao Root diam: 3.40 cm Ao Asc diam:  3.60 cm MITRAL VALVE               TRICUSPID VALVE MV Area (PHT): 4.07 cm    TR Peak grad:   27.7 mmHg MV Decel Time: 187 msec    TR Vmax:        263.00 cm/s MV E velocity: 99.05 cm/s MV A velocity: 89.10 cm/s  SHUNTS MV E/A ratio:  1.11        Systemic VTI:  0.15 m                            Systemic Diam: 2.10 cm Evalene Lunger MD Electronically signed by Evalene Lunger MD Signature Date/Time: 05/17/2024/8:10:12 AM    Final    CT Angio Chest PE W and/or Wo Contrast Result Date: 05/16/2024 CLINICAL DATA:  Fall transferring from bed to wheelchair. Syncopal episode. EXAM: CT ANGIOGRAPHY CHEST CT ABDOMEN AND PELVIS WITH CONTRAST TECHNIQUE: Multidetector CT imaging of the chest was performed using the standard protocol during bolus administration of intravenous contrast. Multiplanar CT image reconstructions and MIPs were obtained to evaluate the vascular anatomy. Multidetector CT imaging of the abdomen and pelvis was performed using the standard protocol during bolus administration of intravenous contrast. RADIATION DOSE REDUCTION: This exam was performed according to the departmental dose-optimization program which includes automated exposure control, adjustment of the mA  and/or kV according to patient size and/or use of iterative reconstruction technique. CONTRAST:  OMNIPAQUE  IOHEXOL  350 MG/ML SOLN COMPARISON:  CT abdomen/pelvis 10/04/2020 FINDINGS: CTA CHEST FINDINGS Cardiovascular: Mild-to-moderate cardiomegaly. Minimal calcified plaque over the left main and 3 vessel coronary arteries. Thoracic aorta is normal in caliber. There is mild calcified plaque over the descending thoracic aorta. Pulmonary arterial system is adequately opacified without evidence of emboli. Remaining vascular structures are unremarkable. Mediastinum/Nodes: 1.3 cm right paratracheal lymph node. Possible 1.3 cm right hilar lymph node. Few other smaller shotty mediastinal lymph nodes. Remaining mediastinal structures are unremarkable. Lungs/Pleura: Lungs are adequately inflated. Masslike density abutting the pleura over the right lower lobe laterally measuring 4.2 x 5.6 cm. Mild linear scarring/atelectasis adjacent the right minor fissure with associated 2.2 x 2.3 cm nodular density over the  posterolateral right upper lobe adjacent the major fissure. Minimal dependent atelectasis over the left base. No effusion. Airways are normal. Musculoskeletal: No focal abnormality. Review of the MIP images confirms the above findings. CT ABDOMEN and PELVIS FINDINGS Hepatobiliary: 2.5 cm hypodensity over the dome of the right lobe of the liver previous to be continuous with the right lower lobe lung mass may be due to local invasion. Liver is otherwise unremarkable. Gallbladder and biliary tree are normal. Pancreas: Normal. Spleen: Normal. Adrenals/Urinary Tract: Adrenal glands are normal. Kidneys are normal in size without definite hydronephrosis or nephrolithiasis. Ureters and bladder are normal. Stomach/Bowel: Stomach and small bowel are normal. Appendix is normal. Colon is unremarkable. Vascular/Lymphatic: Moderate calcified plaque over the abdominal aorta which is normal in caliber. No evidence of adenopathy.  Reproductive: Prostate is unremarkable. Other: No free peritoneal fluid or focal inflammatory change. Musculoskeletal: No focal abnormality. Review of the MIP images confirms the above findings. IMPRESSION: 1. No evidence of pulmonary embolism. 2. 4.2 x 5.6 cm mass abutting the pleura over the right lower lobe laterally with adjacent 2.2 x 2.3 cm nodular density over the posterolateral right upper lobe adjacent the major fissure. 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 Recommend referral to [facility]) for consideration of tissue sampling. 3. 2.5 cm hypodensity over the dome of the right lobe of the liver which appears to be continuous with the right lower lobe lung mass likely metastatic disease due to local invasion. 4. Mild-to-moderate cardiomegaly. Atherosclerotic coronary artery disease. 5. Aortic atherosclerosis. 6. No acute findings in the abdomen/pelvis. Aortic Atherosclerosis (ICD10-I70.0). Electronically Signed   By: Toribio Agreste M.D.   On: 05/16/2024 13:59   CT ABDOMEN PELVIS W CONTRAST Result Date: 05/16/2024 CLINICAL DATA:  Fall transferring from bed to wheelchair. Syncopal episode. EXAM: CT ANGIOGRAPHY CHEST CT ABDOMEN AND PELVIS WITH CONTRAST TECHNIQUE: Multidetector CT imaging of the chest was performed using the standard protocol during bolus administration of intravenous contrast. Multiplanar CT image reconstructions and MIPs were obtained to evaluate the vascular anatomy. Multidetector CT imaging of the abdomen and pelvis was performed using the standard protocol during bolus administration of intravenous contrast. RADIATION DOSE REDUCTION: This exam was performed according to the departmental dose-optimization program which includes automated exposure control, adjustment of the mA and/or kV according to patient size and/or use of iterative reconstruction technique. CONTRAST:  OMNIPAQUE  IOHEXOL  350 MG/ML SOLN COMPARISON:  CT  abdomen/pelvis 10/04/2020 FINDINGS: CTA CHEST FINDINGS Cardiovascular: Mild-to-moderate cardiomegaly. Minimal calcified plaque over the left main and 3 vessel coronary arteries. Thoracic aorta is normal in caliber. There is mild calcified plaque over the descending thoracic aorta. Pulmonary arterial system is adequately opacified without evidence of emboli. Remaining vascular structures are unremarkable. Mediastinum/Nodes: 1.3 cm right paratracheal lymph node. Possible 1.3 cm right hilar lymph node. Few other smaller shotty mediastinal lymph nodes. Remaining mediastinal structures are unremarkable. Lungs/Pleura: Lungs are adequately inflated. Masslike density abutting the pleura over the right lower lobe laterally measuring 4.2 x 5.6 cm. Mild linear scarring/atelectasis adjacent the right minor fissure with associated 2.2 x 2.3 cm nodular density over the posterolateral right upper lobe adjacent the major fissure. Minimal dependent atelectasis over the left base. No effusion. Airways are normal. Musculoskeletal: No focal abnormality. Review of the MIP images confirms the above findings. CT ABDOMEN and PELVIS FINDINGS Hepatobiliary: 2.5 cm hypodensity over the dome of the right lobe of the liver previous to be continuous with the right lower lobe lung mass may be  due to local invasion. Liver is otherwise unremarkable. Gallbladder and biliary tree are normal. Pancreas: Normal. Spleen: Normal. Adrenals/Urinary Tract: Adrenal glands are normal. Kidneys are normal in size without definite hydronephrosis or nephrolithiasis. Ureters and bladder are normal. Stomach/Bowel: Stomach and small bowel are normal. Appendix is normal. Colon is unremarkable. Vascular/Lymphatic: Moderate calcified plaque over the abdominal aorta which is normal in caliber. No evidence of adenopathy. Reproductive: Prostate is unremarkable. Other: No free peritoneal fluid or focal inflammatory change. Musculoskeletal: No focal abnormality. Review of  the MIP images confirms the above findings. IMPRESSION: 1. No evidence of pulmonary embolism. 2. 4.2 x 5.6 cm mass abutting the pleura over the right lower lobe laterally with adjacent 2.2 x 2.3 cm nodular density over the posterolateral right upper lobe adjacent the major fissure. 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 Recommend referral to [facility]) for consideration of tissue sampling. 3. 2.5 cm hypodensity over the dome of the right lobe of the liver which appears to be continuous with the right lower lobe lung mass likely metastatic disease due to local invasion. 4. Mild-to-moderate cardiomegaly. Atherosclerotic coronary artery disease. 5. Aortic atherosclerosis. 6. No acute findings in the abdomen/pelvis. Aortic Atherosclerosis (ICD10-I70.0). Electronically Signed   By: Toribio Agreste M.D.   On: 05/16/2024 13:59   CT HEAD WO CONTRAST ( ) Result Date: 05/16/2024 EXAM: CT HEAD AND CERVICAL SPINE 05/16/2024 01:05:50 PM TECHNIQUE: CT of the head and cervical spine was performed without the administration of intravenous contrast. Multiplanar reformatted images are provided for review. Automated exposure control, iterative reconstruction, and/or weight based adjustment of the mA/kV was utilized to reduce the radiation dose to as low as reasonably achievable. COMPARISON: CT Neck November 16, 2016 CLINICAL HISTORY: Headache, new onset (Age >= 51y) FINDINGS: CT HEAD BRAIN AND VENTRICLES: No acute intracranial hemorrhage. No mass effect or midline shift. No abnormal extra-axial fluid collection. No evidence of acute infarct. No hydrocephalus. Patchy white matter hypodensities, nonspecific but compatible with chronic microvascular ischemic change. ORBITS: No acute abnormality. SINUSES AND MASTOIDS: Right maxillary sinus air-fluid level.  Ethmoid air cell mucosal thickening. Chronically opacified right mastoid air cells with soft tissue and/or fluid  andthinning or dehiscence of the adjacent tegmen. SImilar findings were present on 2018 CT neck. SOFT TISSUES AND SKULL: No acute skull fracture. No acute soft tissue abnormality. CT CERVICAL SPINE BONES AND ALIGNMENT: No acute fracture or traumatic malalignment. DEGENERATIVE CHANGES: No significant degenerative changes. SOFT TISSUES: No prevertebral soft tissue swelling. IMPRESSION: 1. No acute intracranial abnormality. 2. No acute fracture or traumatic malalignment of the cervical spine. 3. Chronically opacified right mastoid air cells with soft tissue and/or fluid and thinning or dehiscence of the adjacent tegmen. Similar findings were present on 2018 CT neck. An MRI with contrast could further evaluate if clinically warranted. Electronically signed by: Gilmore Molt MD 05/16/2024 01:23 PM EST RP Workstation: [id number]   CT Cervical Spine Wo Contrast Result Date: 05/16/2024 EXAM: CT HEAD AND CERVICAL SPINE 05/16/2024 01:05:50 PM TECHNIQUE: CT of the head and cervical spine was performed without the administration of intravenous contrast. Multiplanar reformatted images are provided for review. Automated exposure control, iterative reconstruction, and/or weight based adjustment of the mA/kV was utilized to reduce the radiation dose to as low as reasonably achievable. COMPARISON: CT Neck November 16, 2016 CLINICAL HISTORY: Headache, new onset (Age >= 52y) FINDINGS: CT HEAD BRAIN AND VENTRICLES: No acute intracranial hemorrhage. No mass effect or midline shift. No abnormal extra-axial fluid collection. No evidence  of acute infarct. No hydrocephalus. Patchy white matter hypodensities, nonspecific but compatible with chronic microvascular ischemic change. ORBITS: No acute abnormality. SINUSES AND MASTOIDS: Right maxillary sinus air-fluid level.  Ethmoid air cell mucosal thickening. Chronically opacified right mastoid air cells with soft tissue and/or fluid andthinning or dehiscence of the adjacent tegmen. SImilar  findings were present on 2018 CT neck. SOFT TISSUES AND SKULL: No acute skull fracture. No acute soft tissue abnormality. CT CERVICAL SPINE BONES AND ALIGNMENT: No acute fracture or traumatic malalignment. DEGENERATIVE CHANGES: No significant degenerative changes. SOFT TISSUES: No prevertebral soft tissue swelling. IMPRESSION: 1. No acute intracranial abnormality. 2. No acute fracture or traumatic malalignment of the cervical spine. 3. Chronically opacified right mastoid air cells with soft tissue and/or fluid and thinning or dehiscence of the adjacent tegmen. Similar findings were present on 2018 CT neck. An MRI with contrast could further evaluate if clinically warranted. Electronically signed by: Gilmore Molt MD 05/16/2024 01:23 PM EST RP Workstation: [id number]   DG Chest Port 1 View if patient is in a treatment room. Result Date: 05/16/2024 CLINICAL DATA:  Shortness of breath.  Suspected sepsis. EXAM: PORTABLE CHEST 1 VIEW COMPARISON:  11/16/2016 FINDINGS: Interval enlarged cardiac silhouette, accentuated by a poor inspiration and the portable AP technique. Aortic arch atheromatous calcifications. Increased prominence of the pulmonary vasculature. Stable mild chronic interstitial prominence and mild peribronchial thickening. The thickness of the soft tissues overlying the lower chest make it difficult to assess for airspace opacity and pleural fluid. There is some ill-defined increased density at both lung bases laterally, greater on the right, with evidence of a small right pleural effusion. Unremarkable bones. IMPRESSION: 1. Possible bibasilar atelectasis or pneumonia and small right pleural effusion. Recommend PA and lateral views with a better inspiration when possible. 2. Interval cardiomegaly and pulmonary vascular congestion, accentuated by a poor inspiration. 3. Stable mild chronic interstitial lung disease and mild chronic bronchitic changes. Electronically Signed   By: Elspeth Bathe M.D.   On:  05/16/2024 10:57               LOS: 1 day   Shawndell Schillaci  [facility]   Pager on [email or web address]. If 7PM-7AM, please contact night-coverage at [email or web address]     05/17/2024, 12:31 PM

## 2024-05-18 DIAGNOSIS — J9601 Acute respiratory failure with hypoxia: Secondary | ICD-10-CM | POA: Diagnosis not present

## 2024-05-18 LAB — CBC
HCT: 45.8 % (ref 39.0–52.0)
Hemoglobin: 13.7 g/dL (ref 13.0–17.0)
MCH: 25.6 pg — ABNORMAL LOW (ref 26.0–34.0)
MCHC: 29.9 g/dL — ABNORMAL LOW (ref 30.0–36.0)
MCV: 85.4 fL (ref 80.0–100.0)
Platelets: 235 K/uL (ref 150–400)
RBC: 5.36 MIL/uL (ref 4.22–5.81)
RDW: 18 % — ABNORMAL HIGH (ref 11.5–15.5)
WBC: 9 K/uL (ref 4.0–10.5)
nRBC: 0 % (ref 0.0–0.2)

## 2024-05-18 LAB — BASIC METABOLIC PANEL WITH GFR
Anion gap: 6 (ref 5–15)
BUN: 16 mg/dL (ref 8–23)
CO2: 35 mmol/L — ABNORMAL HIGH (ref 22–32)
Calcium: 8.9 mg/dL (ref 8.9–10.3)
Chloride: 91 mmol/L — ABNORMAL LOW (ref 98–111)
Creatinine, Ser: 0.39 mg/dL — ABNORMAL LOW (ref 0.61–1.24)
GFR, Estimated: >60 mL/min (ref >60)
Glucose, Bld: 109 mg/dL — ABNORMAL HIGH (ref 70–99)
Potassium: 3.6 mmol/L (ref 3.5–5.1)
Sodium: 132 mmol/L — ABNORMAL LOW (ref 135–145)

## 2024-05-18 LAB — GLUCOSE, CAPILLARY
Glucose-Capillary: 116 mg/dL — ABNORMAL HIGH (ref 70–99)
Glucose-Capillary: 138 mg/dL — ABNORMAL HIGH (ref 70–99)
Glucose-Capillary: 183 mg/dL — ABNORMAL HIGH (ref 70–99)
Glucose-Capillary: 284 mg/dL — ABNORMAL HIGH (ref 70–99)

## 2024-05-18 LAB — MAGNESIUM: Magnesium: 2 mg/dL (ref 1.7–2.4)

## 2024-05-18 MED ORDER — POTASSIUM CHLORIDE CRYS ER 20 MEQ PO TBCR
40.0000 meq | EXTENDED_RELEASE_TABLET | Freq: Every day | ORAL | Status: AC
  Administered 2024-05-18 – 2024-05-19 (×2): 40 meq via ORAL
  Filled 2024-05-18 (×2): qty 2

## 2024-05-18 MED ORDER — PREGABALIN 75 MG PO CAPS
75.0000 mg | ORAL_CAPSULE | Freq: Two times a day (BID) | ORAL | Status: DC
  Administered 2024-05-18 – 2024-05-21 (×7): 75 mg via ORAL
  Filled 2024-05-18 (×7): qty 1

## 2024-05-18 MED ORDER — INSULIN ASPART 100 UNIT/ML IJ SOLN
4.0000 [IU] | Freq: Three times a day (TID) | INTRAMUSCULAR | Status: DC
  Administered 2024-05-18 – 2024-05-21 (×10): 4 [IU] via SUBCUTANEOUS
  Filled 2024-05-18 (×10): qty 4

## 2024-05-18 MED ORDER — DULOXETINE HCL 30 MG PO CPEP
60.0000 mg | ORAL_CAPSULE | Freq: Every day | ORAL | Status: DC
  Administered 2024-05-18 – 2024-05-21 (×4): 60 mg via ORAL
  Filled 2024-05-18 (×4): qty 2

## 2024-05-18 NOTE — Progress Notes (Signed)
 Wasted 2.5mg  methadone with Rodvegas Ingram, RN in stericycle.

## 2024-05-18 NOTE — Progress Notes (Signed)
 Progress Note    Jerome [name]  FMW:[id number] DOB: [birth year]  DOA: 05/16/2024 PCP: [facility]      Brief Narrative:    Medical records reviewed and are as summarized below:  Jerome [name] is a 63 y.o. 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  Reportedly, oxygen saturation was in the 50s on room air when EMS arrived.  He was placed on 15 L oxygen via nonrebreather mask and brought to the ED for further management.   Initial vital signs in the ED: Temperature 100.3 F, respiratory rate 18, pulse 109, BP 117/52, oxygen saturation 94% on 6 L oxygen.  proBNP 3,068 Venous blood gas showed pH 7.44, pCO2 62, pO2 97.   Chest x-ray IMPRESSION: 1. Possible bibasilar atelectasis or pneumonia and small right pleural effusion. Recommend PA and lateral views with a better inspiration when possible. 2. Interval cardiomegaly and pulmonary vascular congestion, accentuated by a poor inspiration. 3. Stable mild chronic interstitial lung disease and mild chronic bronchitic changes.   CTA chest IMPRESSION: 1. No evidence of pulmonary embolism. 2. 4.2 x 5.6 cm mass abutting the pleura over the right lower lobe laterally with adjacent 2.2 x 2.3 cm nodular density over the posterolateral right upper lobe adjacent the major fissure. 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 Recommend referral [facility]) for consideration of tissue sampling. 3. 2.5 cm hypodensity over the dome of the right lobe of the liver which appears to be continuous with the right lower lobe lung mass likely metastatic disease due to local invasion. 4. Mild-to-moderate  cardiomegaly. Atherosclerotic coronary artery disease. 5. Aortic atherosclerosis. 6. No acute findings in the abdomen/pelvis.   Aortic Atherosclerosis (ICD10-I70.0).       Assessment/Plan:   Principal Problem:   Acute hypoxemic respiratory failure (HCC) Active Problems:   Chronic pain syndrome   Class 3 obesity (HCC)   COPD with acute exacerbation (HCC)   OSA (obstructive sleep apnea)    Body mass index is 42 kg/m.  (Class II obesity)  S/p syncope: Probably this was due to acute respiratory failure.   Acute hypoxic and hypercapnic respiratory failure: Continue 3 L/min oxygen.  Wean off oxygen as able.  He was previously on 7 L/min oxygen and BiPAP. Suspect he may have underlying chronic hypoxic and hypercapnic respiratory failure   COPD exacerbation: Continue IV antibiotics, steroids and bronchodilators.   Acute on chronic diastolic CHF, anasarca: Continue IV Lasix.  Monitor BMP, daily weights and urine output. Mild aortic stenosis 2D echo showed EF estimated at 50 to 55%, grade 1 diastolic dysfunction, mild aortic stenosis.   Elevated troponins: Troponin trend 105, 97 and 74.  This is likely due to demand ischemia   Hypokalemia: Improved.  Continue potassium repletion because of IV diuresis.   Acute metabolic encephalopathy: Mental status has improved.   Known non-small cell right lung cancer, chronic right-sided chest pain: New lesion in the liver concerning for metastatic disease. His oncologist is at the [facility].  Chart review shows that he is on pembrolizumab and last dose was on 05/04/2024. He said he was supposed to have an MRI for further evaluation of malignancy but he needs sedation before MRI can be  done. He said he was told he would need nerve blockers but the MRI has to be done first.  He has been followed by palliative care team at the [facility]. Continue analgesics for pain.  (Methadone, oxycodone  as needed, IV morphine as  needed). Ordered Cymbalta and Lyrica which were recommended by his palliative care team at the [facility] Follow-up with inpatient palliative care team.   He is on low-dose Xarelto for a clotting disorder   Type II DM with hyperglycemia: Continue Semglee 15 units nightly.  Use NovoLog as needed for hyperglycemia.    Comorbidities include obesity, OSA on CPAP, tobacco use disorder, history of abdominal stents/mesenteric ischemia on Plavix   Plan of care was discussed with the patient in detail.  He said he there was too much going on yesterday so he did not remember everything that was discussed yesterday.  Diagnosis and treatment plan were discussed.  He is agreeable with the plan.  He said he also called his palliative care nurse practitioner/oncology care navigator at the [facility] who said that he should focus on getting his respiratory status stabilized and getting better.  He said he was told not to worry about the cancer at the moment since there was no emergent problem that has to be addressed as far as cancer is concerned.  He was also informed that lung cancer has spread to the liver as discussed yesterday.  MRI will be done as an outpatient.    Diet Order             Diet Heart Room service appropriate? Yes; Fluid consistency: Thin  Diet effective now                                  Consultants: Palliative care  Procedures: None    Medications:    arformoterol  15 mcg Nebulization BID   And   umeclidinium bromide  1 puff Inhalation Daily   atorvastatin  80 mg Oral Daily   azithromycin  500 mg Oral Daily   clopidogrel  75 mg Oral Daily   DULoxetine  60 mg Oral Daily   furosemide  60 mg Intravenous Q12H   insulin aspart  0-5 Units Subcutaneous QHS   insulin aspart  0-9 Units Subcutaneous TID WC   insulin aspart  4 Units Subcutaneous TID WC   insulin glargine-yfgn  15 Units Subcutaneous QHS   ipratropium-albuterol  3 mL Nebulization BID   methadone  7.5  mg Oral Q8H   potassium chloride  40 mEq Oral Daily   predniSONE   40 mg Oral Q breakfast   pregabalin  75 mg Oral BID   rivaroxaban  10 mg Oral Daily   Continuous Infusions:  cefTRIAXone  (ROCEPHIN )  IV 1 g (05/17/24 1736)     Anti-infectives (From admission, onward)    Start     Dose/Rate Route Frequency Ordered Stop   05/16/24 1800  cefTRIAXone  (ROCEPHIN ) 1 g in sodium chloride  0.9 % 100 mL IVPB        1 g 200 mL/hr over 30 Minutes Intravenous Every 24 hours 05/16/24 1742     05/16/24 1745  azithromycin (ZITHROMAX) tablet 500 mg        500 mg Oral Daily 05/16/24 1742     05/16/24 1030  ceFEPIme (MAXIPIME) 2 g in sodium chloride  0.9 % 100 mL IVPB        2 g 200 mL/hr over  30 Minutes Intravenous  Once 05/16/24 1026 05/16/24 1211   05/16/24 1030  metroNIDAZOLE  (FLAGYL ) IVPB 500 mg        500 mg 100 mL/hr over 60 Minutes Intravenous  Once 05/16/24 1026 05/16/24 1211   05/16/24 1030  vancomycin  (VANCOCIN ) IVPB 1000 mg/200 mL premix        1,000 mg 200 mL/hr over 60 Minutes Intravenous  Once 05/16/24 1026 05/16/24 1404              Family Communication/Anticipated D/C date and plan/Code Status   DVT prophylaxis: rivaroxaban  (XARELTO ) tablet 10 mg Start: 05/17/24 1000 SCDs Start: 05/16/24 1735 rivaroxaban  (XARELTO ) tablet 10 mg     Code Status: Full Code  Family Communication: None Disposition Plan: Plan to discharge home   Status is: Inpatient Remains inpatient appropriate because: Lung cancer, CHF exacerbation, COPD exacerbation and respiratory failure       Subjective:   Interval events noted.  He complains of right lower chest/right abdominal upper quadrant pain.  No shortness of breath.  Olam, RN, nurse manager, was at the bedside.  Rodvegas, RN, was at the bedside.  Objective:    Vitals:   05/18/24 0355 05/18/24 0500 05/18/24 0851 05/18/24 1229  BP: (!) 105/54  (!) 128/57 (!) 118/59  Pulse: 68  71 84  Resp: 20  18 20   Temp: 97.6 F (36.4 C)  (!)  97.5 F (36.4 C) 98.3 F (36.8 C)  TempSrc:   Oral Oral  SpO2: 97%  95% 95%  Weight:  129 kg    Height:       No data found.   Intake/Output Summary (Last 24 hours) at 05/18/2024 1239 Last data filed at 05/18/2024 1005 Gross per 24 hour  Intake 1140 ml  Output 2900 ml  Net -1760 ml   Filed Weights   05/16/24 1007 05/18/24 0500  Weight: 122 kg 129 kg    Exam:  GEN: NAD SKIN: Warm and dry EYES: No pallor or icterus ENT: MMM CV: RRR PULM: CTA B ABD: soft, obese, NT, +BS CNS: AAO x 3, non focal EXT: Edema of bilateral lower extremities from the thighs to the feet.       Data Reviewed:   I have personally reviewed following labs and imaging studies:  Labs: Labs show the following:   Basic Metabolic Panel: Recent Labs  Lab 05/16/24 1012 05/17/24 0207 05/18/24 0357  NA 138 135 132*  K 4.0 3.4* 3.6  CL 94* 92* 91*  CO2 36* 36* 35*  GLUCOSE 159* 244* 109*  BUN 19 17 16   CREATININE 0.56* 0.48* 0.39*  CALCIUM  8.7* 8.5* 8.9  MG  --  1.9 2.0   GFR Estimated Creatinine Clearance: 127.3 mL/min (A) (by C-G formula based on SCr of 0.39 mg/dL (L)). Liver Function Tests: Recent Labs  Lab 05/16/24 1012  AST 55*  ALT 22  ALKPHOS 99  BILITOT 0.7  PROT 6.8  ALBUMIN 2.6*   No results for input(s): LIPASE, AMYLASE in the last 168 hours. No results for input(s): AMMONIA in the last 168 hours. Coagulation profile Recent Labs  Lab 05/16/24 1012  INR 1.2    CBC: Recent Labs  Lab 05/16/24 1012 05/18/24 0357  WBC 12.5* 9.0  NEUTROABS 10.7*  --   HGB 14.2 13.7  HCT 47.2 45.8  MCV 85.0 85.4  PLT 274 235   Cardiac Enzymes: No results for input(s): CKTOTAL, CKMB, CKMBINDEX, TROPONINI in the last 168 hours. BNP (last 3 results) Recent Labs  05/16/24 1400  PROBNP 3,068.0*   CBG: Recent Labs  Lab 05/17/24 1210 05/17/24 1601 05/17/24 2036 05/18/24 0849 05/18/24 1227  GLUCAP 253* 272* 258* 116* 138*   D-Dimer: No results for  input(s): DDIMER in the last 72 hours. Hgb A1c: Recent Labs    05/16/24 2016  HGBA1C 7.5*   Lipid Profile: No results for input(s): CHOL, HDL, LDLCALC, TRIG, CHOLHDL, LDLDIRECT in the last 72 hours. Thyroid function studies: No results for input(s): TSH, T4TOTAL, T3FREE, THYROIDAB in the last 72 hours.  Invalid input(s): FREET3 Anemia work up: No results for input(s): VITAMINB12, FOLATE, FERRITIN, TIBC, IRON, RETICCTPCT in the last 72 hours. Sepsis Labs: Recent Labs  Lab 05/16/24 1012 05/16/24 1400 05/18/24 0357  WBC 12.5*  --  9.0  LATICACIDVEN 1.9 1.1  --     Microbiology Recent Results (from the past 240 hours)  Culture, blood (Routine x 2)     Status: None (Preliminary result)   Collection Time: 05/16/24 10:14 AM   Specimen: BLOOD  Result Value Ref Range Status   Specimen Description BLOOD BLOOD RIGHT ARM  Final   Special Requests   Final    BOTTLES DRAWN AEROBIC AND ANAEROBIC Blood Culture results may not be optimal due to an inadequate volume of blood received in culture bottles   Culture   Final    NO GROWTH < 24 HOURS Performed at [facility], [address], [place], [place] [address]    Report Status PENDING  Incomplete  Resp panel by RT-PCR (RSV, Flu A&B, Covid) Anterior Nasal Swab     Status: None   Collection Time: 05/16/24 10:19 AM   Specimen: Anterior Nasal Swab  Result Value Ref Range Status   SARS Coronavirus 2 by RT PCR NEGATIVE NEGATIVE Final    Comment: (NOTE) SARS-CoV-2 target nucleic acids are NOT DETECTED.  The SARS-CoV-2 RNA is generally detectable in upper respiratory specimens during the acute phase of infection. The lowest concentration of SARS-CoV-2 viral copies this assay can detect is 138 copies/mL. A negative result does not preclude SARS-Cov-2 infection and should not be used as the sole basis for treatment or other patient management decisions. A negative result may occur with   improper specimen collection/handling, submission of specimen other than nasopharyngeal swab, presence of viral mutation(s) within the areas targeted by this assay, and inadequate number of viral copies(<138 copies/mL). A negative result must be combined with clinical observations, patient history, and epidemiological information. The expected result is Negative.  Fact Sheet for Patients:  bloggercourse.com  Fact Sheet for Healthcare Providers:  seriousbroker.it  This test is no t yet approved or cleared by the [place]  FDA and  has been authorized for detection and/or diagnosis of SARS-CoV-2 by FDA under an Emergency Use Authorization (EUA). This EUA will remain  in effect (meaning this test can be used) for the duration of the COVID-19 declaration under Section 564(b)(1) of the Act, 21 U.S.C.section 360bbb-3(b)(1), unless the authorization is terminated  or revoked sooner.       Influenza A by PCR NEGATIVE NEGATIVE Final   Influenza B by PCR NEGATIVE NEGATIVE Final    Comment: (NOTE) The Xpert Xpress SARS-CoV-2/FLU/RSV plus assay is intended as an aid in the diagnosis of influenza from Nasopharyngeal swab specimens and should not be used as a sole basis for treatment. Nasal washings and aspirates are unacceptable for Xpert Xpress SARS-CoV-2/FLU/RSV testing.  Fact Sheet for Patients: bloggercourse.com  Fact Sheet for Healthcare Providers: seriousbroker.it  This test is  not yet approved or cleared by the [place]  FDA and has been authorized for detection and/or diagnosis of SARS-CoV-2 by FDA under an Emergency Use Authorization (EUA). This EUA will remain in effect (meaning this test can be used) for the duration of the COVID-19 declaration under Section 564(b)(1) of the Act, 21 U.S.C. section 360bbb-3(b)(1), unless the authorization is terminated or revoked.      Resp Syncytial Virus by PCR NEGATIVE NEGATIVE Final    Comment: (NOTE) Fact Sheet for Patients: bloggercourse.com  Fact Sheet for Healthcare Providers: seriousbroker.it  This test is not yet approved or cleared by the [place]  FDA and has been authorized for detection and/or diagnosis of SARS-CoV-2 by FDA under an Emergency Use Authorization (EUA). This EUA will remain in effect (meaning this test can be used) for the duration of the COVID-19 declaration under Section 564(b)(1) of the Act, 21 U.S.C. section 360bbb-3(b)(1), unless the authorization is terminated or revoked.  Performed at [facility], [address] [place], [place] [address]   Respiratory (~20 pathogens) panel by PCR     Status: None   Collection Time: 05/16/24  8:16 PM   Specimen: Nasopharyngeal Swab; Respiratory  Result Value Ref Range Status   Adenovirus NOT DETECTED NOT DETECTED Final   Coronavirus 229E NOT DETECTED NOT DETECTED Final    Comment: (NOTE) The Coronavirus on the Respiratory Panel, DOES NOT test for the novel  Coronavirus (2019 nCoV)    Coronavirus HKU1 NOT DETECTED NOT DETECTED Final   Coronavirus NL63 NOT DETECTED NOT DETECTED Final   Coronavirus OC43 NOT DETECTED NOT DETECTED Final   Metapneumovirus NOT DETECTED NOT DETECTED Final   Rhinovirus / Enterovirus NOT DETECTED NOT DETECTED Final   Influenza A NOT DETECTED NOT DETECTED Final   Influenza B NOT DETECTED NOT DETECTED Final   Parainfluenza Virus 1 NOT DETECTED NOT DETECTED Final   Parainfluenza Virus 2 NOT DETECTED NOT DETECTED Final   Parainfluenza Virus 3 NOT DETECTED NOT DETECTED Final   Parainfluenza Virus 4 NOT DETECTED NOT DETECTED Final   Respiratory Syncytial Virus NOT DETECTED NOT DETECTED Final   Bordetella pertussis NOT DETECTED NOT DETECTED Final   Bordetella Parapertussis NOT DETECTED NOT DETECTED Final   Chlamydophila pneumoniae NOT DETECTED NOT  DETECTED Final   Mycoplasma pneumoniae NOT DETECTED NOT DETECTED Final    Comment: Performed at [facility], [address], [place], [place] [address]  Culture, blood (Routine X 2) w Reflex to ID Panel     Status: None (Preliminary result)   Collection Time: 05/17/24  2:07 AM   Specimen: BLOOD  Result Value Ref Range Status   Specimen Description BLOOD RIGHT ANTECUBITAL  Final   Special Requests   Final    BOTTLES DRAWN AEROBIC AND ANAEROBIC Blood Culture adequate volume   Culture   Final    NO GROWTH < 12 HOURS Performed at [facility], [address] [place], [place] [address]    Report Status PENDING  Incomplete    Procedures and diagnostic studies:  ECHOCARDIOGRAM COMPLETE Result Date: 05/17/2024    ECHOCARDIOGRAM REPORT   Patient Name:   ARTEM BUNTE Date of Exam: 05/16/2024 Medical Rec #:  [id number]        Height:       69.0 in Accession #:    [id number]       Weight:       269.0 lb Date of Birth:  06-04-1961        BSA:  2.343 m Patient Age:    63 years         BP:           103/54 mmHg Patient Gender: M                HR:           80 bpm. Exam Location:  [facility] Procedure: 2D Echo, Cardiac Doppler and Color Doppler (Both Spectral and Color            Flow Doppler were utilized during procedure). Indications:     I50.31 Acute Diastolic CHF  History:         Patient has no prior history of Echocardiogram examinations.                  COPD; Risk Factors:Hypertension, Diabetes, Dyslipidemia and                  Sleep Apnea.  Sonographer:     Carl Coma RDCS Referring Phys:  [id number] MURVIN MANA Diagnosing Phys: Evalene Lunger MD  Sonographer Comments: Technically difficult study due to poor echo windows. IMPRESSIONS  1. Left ventricular ejection fraction, by estimation, is 50 to 55%. The left ventricle has low normal function. The left ventricle has no regional wall motion abnormalities. Left ventricular diastolic parameters are consistent with Grade I  diastolic dysfunction (impaired relaxation).  2. Right ventricular systolic function is mildly reduced. The right ventricular size is mildly enlarged. There is normal pulmonary artery systolic pressure. The estimated right ventricular systolic pressure is 32.7 mmHg.  3. The mitral valve is normal in structure. No evidence of mitral valve regurgitation. No evidence of mitral stenosis.  4. The aortic valve is calcified. Aortic valve regurgitation is not visualized. Mild aortic valve stenosis. Aortic valve area, by VTI measures 1.12 cm. Aortic valve mean gradient measures 11.8 mmHg. Aortic valve Vmax measures 2.31 m/s.  5. The inferior vena cava is normal in size with <50% respiratory variability, suggesting right atrial pressure of 8 mmHg. FINDINGS  Left Ventricle: Left ventricular ejection fraction, by estimation, is 50 to 55%. The left ventricle has low normal function. The left ventricle has no regional wall motion abnormalities. Strain was performed and the global longitudinal strain is indeterminate. The left ventricular internal cavity size was normal in size. There is no left ventricular hypertrophy. Left ventricular diastolic parameters are consistent with Grade I diastolic dysfunction (impaired relaxation). Right Ventricle: The right ventricular size is mildly enlarged. No increase in right ventricular wall thickness. Right ventricular systolic function is mildly reduced. There is normal pulmonary artery systolic pressure. The tricuspid regurgitant velocity  is 2.63 m/s, and with an assumed right atrial pressure of 5 mmHg, the estimated right ventricular systolic pressure is 32.7 mmHg. Left Atrium: Left atrial size was normal in size. Right Atrium: Right atrial size was normal in size. Pericardium: Trivial pericardial effusion is present. Mitral Valve: The mitral valve is normal in structure. No evidence of mitral valve regurgitation. No evidence of mitral valve stenosis. Tricuspid Valve: The tricuspid valve  is normal in structure. Tricuspid valve regurgitation is mild . No evidence of tricuspid stenosis. The aortic valve is calcified. Aortic valve regurgitation is not visualized. Mild aortic stenosis is present. Pulmonic Valve: The pulmonic valve was normal in structure. Pulmonic valve regurgitation is not visualized. No evidence of pulmonic stenosis. Aorta: The aortic root is normal in size and structure. Venous: The inferior vena cava is normal in size with less than 50% respiratory  variability, suggesting right atrial pressure of 8 mmHg. IAS/Shunts: No atrial level shunt detected by color flow Doppler. Additional Comments: 3D was performed not requiring image post processing on an independent workstation and was indeterminate.  LEFT VENTRICLE PLAX 2D LVIDd:         5.30 cm   Diastology LVIDs:         3.80 cm   LV e' medial:    8.78 cm/s LV PW:         0.90 cm   LV E/e' medial:  11.3 LV IVS:        0.70 cm   LV e' lateral:   9.89 cm/s LVOT diam:     2.10 cm   LV E/e' lateral: 10.0 LV SV:         52 LV SV Index:   22 LVOT Area:     3.46 cm  RIGHT VENTRICLE             IVC RV Basal diam:  4.00 cm     IVC diam: 2.80 cm RV S prime:     11.02 cm/s TAPSE (M-mode): 2.3 cm LEFT ATRIUM             Index        RIGHT ATRIUM           Index LA diam:        5.30 cm 2.26 cm/m   RA Area:     19.10 cm LA Vol (A2C):   84.5 ml 36.06 ml/m  RA Volume:   60.00 ml  25.61 ml/m LA Vol (A4C):   40.7 ml 17.37 ml/m LA Biplane Vol: 62.3 ml 26.59 ml/m  AORTIC VALVE AV Area (Vmax):    1.11 cm AV Area (Vmean):   1.08 cm AV Area (VTI):     1.12 cm AV Vmax:           231.00 cm/s AV Vmean:          159.200 cm/s AV VTI:            0.462 m AV Peak Grad:      21.3 mmHg AV Mean Grad:      11.8 mmHg LVOT Vmax:         74.15 cm/s LVOT Vmean:        49.500 cm/s LVOT VTI:          0.150 m LVOT/AV VTI ratio: 0.32  AORTA Ao Root diam: 3.40 cm Ao Asc diam:  3.60 cm MITRAL VALVE               TRICUSPID VALVE MV Area (PHT): 4.07 cm    TR Peak grad:    27.7 mmHg MV Decel Time: 187 msec    TR Vmax:        263.00 cm/s MV E velocity: 99.05 cm/s MV A velocity: 89.10 cm/s  SHUNTS MV E/A ratio:  1.11        Systemic VTI:  0.15 m                            Systemic Diam: 2.10 cm Evalene Lunger MD Electronically signed by Evalene Lunger MD Signature Date/Time: 05/17/2024/8:10:12 AM    Final    CT Angio Chest PE W and/or Wo Contrast Result Date: 05/16/2024 CLINICAL DATA:  Fall transferring from bed to wheelchair. Syncopal episode. EXAM: CT ANGIOGRAPHY CHEST CT ABDOMEN AND PELVIS WITH CONTRAST TECHNIQUE: Multidetector CT imaging  of the chest was performed using the standard protocol during bolus administration of intravenous contrast. Multiplanar CT image reconstructions and MIPs were obtained to evaluate the vascular anatomy. Multidetector CT imaging of the abdomen and pelvis was performed using the standard protocol during bolus administration of intravenous contrast. RADIATION DOSE REDUCTION: This exam was performed according to the departmental dose-optimization program which includes automated exposure control, adjustment of the mA and/or kV according to patient size and/or use of iterative reconstruction technique. CONTRAST:  OMNIPAQUE  IOHEXOL  350 MG/ML SOLN COMPARISON:  CT abdomen/pelvis 10/04/2020 FINDINGS: CTA CHEST FINDINGS Cardiovascular: Mild-to-moderate cardiomegaly. Minimal calcified plaque over the left main and 3 vessel coronary arteries. Thoracic aorta is normal in caliber. There is mild calcified plaque over the descending thoracic aorta. Pulmonary arterial system is adequately opacified without evidence of emboli. Remaining vascular structures are unremarkable. Mediastinum/Nodes: 1.3 cm right paratracheal lymph node. Possible 1.3 cm right hilar lymph node. Few other smaller shotty mediastinal lymph nodes. Remaining mediastinal structures are unremarkable. Lungs/Pleura: Lungs are adequately inflated. Masslike density abutting the pleura over the  right lower lobe laterally measuring 4.2 x 5.6 cm. Mild linear scarring/atelectasis adjacent the right minor fissure with associated 2.2 x 2.3 cm nodular density over the posterolateral right upper lobe adjacent the major fissure. Minimal dependent atelectasis over the left base. No effusion. Airways are normal. Musculoskeletal: No focal abnormality. Review of the MIP images confirms the above findings. CT ABDOMEN and PELVIS FINDINGS Hepatobiliary: 2.5 cm hypodensity over the dome of the right lobe of the liver previous to be continuous with the right lower lobe lung mass may be due to local invasion. Liver is otherwise unremarkable. Gallbladder and biliary tree are normal. Pancreas: Normal. Spleen: Normal. Adrenals/Urinary Tract: Adrenal glands are normal. Kidneys are normal in size without definite hydronephrosis or nephrolithiasis. Ureters and bladder are normal. Stomach/Bowel: Stomach and small bowel are normal. Appendix is normal. Colon is unremarkable. Vascular/Lymphatic: Moderate calcified plaque over the abdominal aorta which is normal in caliber. No evidence of adenopathy. Reproductive: Prostate is unremarkable. Other: No free peritoneal fluid or focal inflammatory change. Musculoskeletal: No focal abnormality. Review of the MIP images confirms the above findings. IMPRESSION: 1. No evidence of pulmonary embolism. 2. 4.2 x 5.6 cm mass abutting the pleura over the right lower lobe laterally with adjacent 2.2 x 2.3 cm nodular density over the posterolateral right upper lobe adjacent the major fissure. 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 Recommend referral to [facility]-Disciplinary [facility] Oncology Clinic [facility]) for consideration of tissue sampling. 3. 2.5 cm hypodensity over the dome of the right lobe of the liver which appears to be continuous with the right lower lobe lung mass likely metastatic disease due to local invasion. 4. Mild-to-moderate cardiomegaly. Atherosclerotic coronary  artery disease. 5. Aortic atherosclerosis. 6. No acute findings in the abdomen/pelvis. Aortic Atherosclerosis (ICD10-I70.0). Electronically Signed   By: Toribio Agreste M.D.   On: 05/16/2024 13:59   CT ABDOMEN PELVIS W CONTRAST Result Date: 05/16/2024 CLINICAL DATA:  Fall transferring from bed to wheelchair. Syncopal episode. EXAM: CT ANGIOGRAPHY CHEST CT ABDOMEN AND PELVIS WITH CONTRAST TECHNIQUE: Multidetector CT imaging of the chest was performed using the standard protocol during bolus administration of intravenous contrast. Multiplanar CT image reconstructions and MIPs were obtained to evaluate the vascular anatomy. Multidetector CT imaging of the abdomen and pelvis was performed using the standard protocol during bolus administration of intravenous contrast. RADIATION DOSE REDUCTION: This exam was performed according to the departmental dose-optimization program which includes automated exposure  control, adjustment of the mA and/or kV according to patient size and/or use of iterative reconstruction technique. CONTRAST:  OMNIPAQUE  IOHEXOL  350 MG/ML SOLN COMPARISON:  CT abdomen/pelvis 10/04/2020 FINDINGS: CTA CHEST FINDINGS Cardiovascular: Mild-to-moderate cardiomegaly. Minimal calcified plaque over the left main and 3 vessel coronary arteries. Thoracic aorta is normal in caliber. There is mild calcified plaque over the descending thoracic aorta. Pulmonary arterial system is adequately opacified without evidence of emboli. Remaining vascular structures are unremarkable. Mediastinum/Nodes: 1.3 cm right paratracheal lymph node. Possible 1.3 cm right hilar lymph node. Few other smaller shotty mediastinal lymph nodes. Remaining mediastinal structures are unremarkable. Lungs/Pleura: Lungs are adequately inflated. Masslike density abutting the pleura over the right lower lobe laterally measuring 4.2 x 5.6 cm. Mild linear scarring/atelectasis adjacent the right minor fissure with associated 2.2 x 2.3 cm nodular  density over the posterolateral right upper lobe adjacent the major fissure. Minimal dependent atelectasis over the left base. No effusion. Airways are normal. Musculoskeletal: No focal abnormality. Review of the MIP images confirms the above findings. CT ABDOMEN and PELVIS FINDINGS Hepatobiliary: 2.5 cm hypodensity over the dome of the right lobe of the liver previous to be continuous with the right lower lobe lung mass may be due to local invasion. Liver is otherwise unremarkable. Gallbladder and biliary tree are normal. Pancreas: Normal. Spleen: Normal. Adrenals/Urinary Tract: Adrenal glands are normal. Kidneys are normal in size without definite hydronephrosis or nephrolithiasis. Ureters and bladder are normal. Stomach/Bowel: Stomach and small bowel are normal. Appendix is normal. Colon is unremarkable. Vascular/Lymphatic: Moderate calcified plaque over the abdominal aorta which is normal in caliber. No evidence of adenopathy. Reproductive: Prostate is unremarkable. Other: No free peritoneal fluid or focal inflammatory change. Musculoskeletal: No focal abnormality. Review of the MIP images confirms the above findings. IMPRESSION: 1. No evidence of pulmonary embolism. 2. 4.2 x 5.6 cm mass abutting the pleura over the right lower lobe laterally with adjacent 2.2 x 2.3 cm nodular density over the posterolateral right upper lobe adjacent the major fissure. 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 Recommend referral to Multi-Disciplinary Thoracic Oncology Clinic [facility]) for consideration of tissue sampling. 3. 2.5 cm hypodensity over the dome of the right lobe of the liver which appears to be continuous with the right lower lobe lung mass likely metastatic disease due to local invasion. 4. Mild-to-moderate cardiomegaly. Atherosclerotic coronary artery disease. 5. Aortic atherosclerosis. 6. No acute findings in the abdomen/pelvis. Aortic Atherosclerosis (ICD10-I70.0). Electronically Signed   By:  Toribio Agreste M.D.   On: 05/16/2024 13:59   CT HEAD WO CONTRAST ( ) Result Date: 05/16/2024 EXAM: CT HEAD AND CERVICAL SPINE 05/16/2024 01:05:50 PM TECHNIQUE: CT of the head and cervical spine was performed without the administration of intravenous contrast. Multiplanar reformatted images are provided for review. Automated exposure control, iterative reconstruction, and/or weight based adjustment of the mA/kV was utilized to reduce the radiation dose to as low as reasonably achievable. COMPARISON: CT Neck November 16, 2016 CLINICAL HISTORY: Headache, new onset (Age >= 51y) FINDINGS: CT HEAD BRAIN AND VENTRICLES: No acute intracranial hemorrhage. No mass effect or midline shift. No abnormal extra-axial fluid collection. No evidence of acute infarct. No hydrocephalus. Patchy white matter hypodensities, nonspecific but compatible with chronic microvascular ischemic change. ORBITS: No acute abnormality. SINUSES AND MASTOIDS: Right maxillary sinus air-fluid level.  Ethmoid air cell mucosal thickening. Chronically opacified right mastoid air cells with soft tissue and/or fluid andthinning or dehiscence of the adjacent tegmen. SImilar findings were present on 2018 CT neck.  SOFT TISSUES AND SKULL: No acute skull fracture. No acute soft tissue abnormality. CT CERVICAL SPINE BONES AND ALIGNMENT: No acute fracture or traumatic malalignment. DEGENERATIVE CHANGES: No significant degenerative changes. SOFT TISSUES: No prevertebral soft tissue swelling. IMPRESSION: 1. No acute intracranial abnormality. 2. No acute fracture or traumatic malalignment of the cervical spine. 3. Chronically opacified right mastoid air cells with soft tissue and/or fluid and thinning or dehiscence of the adjacent tegmen. Similar findings were present on 2018 CT neck. An MRI with contrast could further evaluate if clinically warranted. Electronically signed by: Gilmore Molt MD 05/16/2024 01:23 PM EST RP Workstation: [id number]   CT Cervical Spine Wo  Contrast Result Date: 05/16/2024 EXAM: CT HEAD AND CERVICAL SPINE 05/16/2024 01:05:50 PM TECHNIQUE: CT of the head and cervical spine was performed without the administration of intravenous contrast. Multiplanar reformatted images are provided for review. Automated exposure control, iterative reconstruction, and/or weight based adjustment of the mA/kV was utilized to reduce the radiation dose to as low as reasonably achievable. COMPARISON: CT Neck November 16, 2016 CLINICAL HISTORY: Headache, new onset (Age >= 51y) FINDINGS: CT HEAD BRAIN AND VENTRICLES: No acute intracranial hemorrhage. No mass effect or midline shift. No abnormal extra-axial fluid collection. No evidence of acute infarct. No hydrocephalus. Patchy white matter hypodensities, nonspecific but compatible with chronic microvascular ischemic change. ORBITS: No acute abnormality. SINUSES AND MASTOIDS: Right maxillary sinus air-fluid level.  Ethmoid air cell mucosal thickening. Chronically opacified right mastoid air cells with soft tissue and/or fluid andthinning or dehiscence of the adjacent tegmen. SImilar findings were present on 2018 CT neck. SOFT TISSUES AND SKULL: No acute skull fracture. No acute soft tissue abnormality. CT CERVICAL SPINE BONES AND ALIGNMENT: No acute fracture or traumatic malalignment. DEGENERATIVE CHANGES: No significant degenerative changes. SOFT TISSUES: No prevertebral soft tissue swelling. IMPRESSION: 1. No acute intracranial abnormality. 2. No acute fracture or traumatic malalignment of the cervical spine. 3. Chronically opacified right mastoid air cells with soft tissue and/or fluid and thinning or dehiscence of the adjacent tegmen. Similar findings were present on 2018 CT neck. An MRI with contrast could further evaluate if clinically warranted. Electronically signed by: Gilmore Molt MD 05/16/2024 01:23 PM EST RP Workstation: [id number]               LOS: 2 days   Kameran Lallier  [facility]   Pager  on [email or web address]. If 7PM-7AM, please contact night-coverage at [email or web address]     05/18/2024, 12:39 PM

## 2024-05-18 NOTE — Plan of Care (Signed)
   Problem: Education: Goal: Ability to describe self-care measures that may prevent or decrease complications (Diabetes Survival Skills Education) will improve Outcome: Progressing Goal: Individualized Educational Video(s) Outcome: Progressing   Problem: Coping: Goal: Ability to adjust to condition or change in health will improve Outcome: Progressing

## 2024-05-18 NOTE — Progress Notes (Signed)
 Patient expressed is concerned about what the plan is for his care. He receives care at the [facility]. He was scheduled (by Palliative Care and Nicole Paterno NP at chemo clinicin [facility]) for an fully sedating MRI at [facility] on Tuesday to localize the area in the right flank (where his tumor is) for a nerve block. That didn't happen because he ended up here in the Ed. 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 He was unclear what the plan of care for him is when he was talking with his NP Are we trying to stablaize him here and send to [place] or Stabilize him and send home, and what are we going to do about the MRI. Communicated to ICM and MD.

## 2024-05-18 NOTE — Plan of Care (Signed)

## 2024-05-18 NOTE — Progress Notes (Cosign Needed Addendum)
 Wasted 2.5mg  tablet of methadone, witnessed by [employer] RN, wasted in stericycle.

## 2024-05-18 NOTE — Progress Notes (Signed)
 Wasted 2.5 mg oral tablet of methadone,witnessed by Gaetano French, RN. Wasted in immunologist.

## 2024-05-19 DIAGNOSIS — Z515 Encounter for palliative care: Secondary | ICD-10-CM

## 2024-05-19 DIAGNOSIS — G894 Chronic pain syndrome: Secondary | ICD-10-CM

## 2024-05-19 DIAGNOSIS — J9601 Acute respiratory failure with hypoxia: Secondary | ICD-10-CM | POA: Diagnosis not present

## 2024-05-19 DIAGNOSIS — Z7189 Other specified counseling: Secondary | ICD-10-CM

## 2024-05-19 LAB — RENAL FUNCTION PANEL
Albumin: 2.7 g/dL — ABNORMAL LOW (ref 3.5–5.0)
Anion gap: 6 (ref 5–15)
BUN: 16 mg/dL (ref 8–23)
CO2: 35 mmol/L — ABNORMAL HIGH (ref 22–32)
Calcium: 9 mg/dL (ref 8.9–10.3)
Chloride: 95 mmol/L — ABNORMAL LOW (ref 98–111)
Creatinine, Ser: 0.46 mg/dL — ABNORMAL LOW (ref 0.61–1.24)
GFR, Estimated: >60 mL/min (ref >60)
Glucose, Bld: 126 mg/dL — ABNORMAL HIGH (ref 70–99)
Phosphorus: 3.6 mg/dL (ref 2.5–4.6)
Potassium: 3.9 mmol/L (ref 3.5–5.1)
Sodium: 135 mmol/L (ref 135–145)

## 2024-05-19 LAB — GLUCOSE, CAPILLARY
Glucose-Capillary: 127 mg/dL — ABNORMAL HIGH (ref 70–99)
Glucose-Capillary: 211 mg/dL — ABNORMAL HIGH (ref 70–99)
Glucose-Capillary: 255 mg/dL — ABNORMAL HIGH (ref 70–99)
Glucose-Capillary: 242 mg/dL — ABNORMAL HIGH (ref 70–99)

## 2024-05-19 LAB — MAGNESIUM: Magnesium: 2 mg/dL (ref 1.7–2.4)

## 2024-05-19 MED ORDER — POTASSIUM CHLORIDE CRYS ER 20 MEQ PO TBCR
40.0000 meq | EXTENDED_RELEASE_TABLET | Freq: Every day | ORAL | Status: AC
  Administered 2024-05-20 – 2024-05-21 (×2): 40 meq via ORAL
  Filled 2024-05-19 (×2): qty 2

## 2024-05-19 NOTE — Progress Notes (Signed)
   05/19/24 1649  Spiritual Encounters  Type of Visit Initial  Care provided to: Patient  Referral source Nurse (RN/NT/LPN)  Reason for visit Advance directives  OnCall Visit Yes  Advance Directives (For Healthcare)  Does Patient Have a Medical Advance Directive? Yes  Does patient want to make changes to medical advance directive? Yes (Inpatient - patient defers changing a medical advance directive at this time - Information given)   Chaplain provided AD paperwork to patient.  Patient had AD paperwork completed at [facility], and [facility] to [facility] so that it can be scanned into his chart.

## 2024-05-19 NOTE — TOC CM/SW Note (Signed)
 Transition of Care [facility]) CM/SW Note   Transition of Care [facility]) - Inpatient Brief Assessment   Patient Details  Name: Jerome [name] MRN: [id number] Date of Birth: [birth year]  Transition of Care [facility]) CM/SW Contact:    Alfonso Rummer, LCSW Phone Number: 05/19/2024, 2:51 PM   Clinical Narrative:  Completed toc chart review no toc needs identified. Please contact toc should needs arise.   Transition of Care Asessment: Insurance and Status: Insurance coverage has been reviewed Patient has primary care physician: Yes [facility]) Home environment has been reviewed: single family home     Social Drivers of Health Review: SDOH reviewed needs interventions Readmission risk has been reviewed: No Transition of care needs: transition of care needs identified, TOC will continue to follow

## 2024-05-19 NOTE — Evaluation (Signed)
 Physical Therapy Evaluation Patient Details Name: Jerome Jerome [name] MRN: [id number] DOB: 1961/05/21 Today's Date: 05/19/2024  History of Present Illness  Pt is a 63 y.o. 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 MD assessment includes: Acute resp faulure, hypoxemia, hypercapnia, COPD exacerbation, syncope and collapse secondary to acute resp failure.    Clinical Impression   Pt was lying in bed on arrival, was pleasant and somewhat motivated to participate during the session and put forth good effort throughout. Pt was able to mobilize to EOB with HOB elevated and max cueing. He was able to transfer using bariatric walker and min A +2 to [facility] -- was left on [facility] with nursing present. Pts SpO2 and HR were checked at before, during and after activity -- 89% on 2L [place] at baseline; 87% with activity, MD in room and notified. Pt will benefit from continued PT services upon discharge to safely address deficits listed in patient problem list for decreased caregiver assistance and eventual return to PLOF.         If plan is discharge home, recommend the following: A little help with walking and/or transfers;A little help with bathing/dressing/bathroom;Assistance with cooking/housework;Assist for transportation;Help with Jerome [name] or ramp for entrance   Can travel by private vehicle        Equipment Recommendations    Recommendations for Other Services       Functional Status Assessment Patient has had a recent decline in their functional status and demonstrates the ability to make significant improvements in function in a reasonable and predictable amount of time.     Precautions / Restrictions Precautions Precautions: Fall Recall of Precautions/Restrictions: Intact Restrictions Weight Bearing Restrictions Per Provider  Order: No      Mobility  Bed Mobility Overal bed mobility: Needs Assistance Bed Mobility: Supine to Sit     Supine to sit: Supervision     General bed mobility comments: needed cueing for sequencing L LE to EOB, cueing for scooting and UE positioning, HOB elevated --used bed rails for assistance    Transfers Overall transfer level: Needs assistance Equipment used: Rolling walker (2 wheels) Transfers: Bed to chair/wheelchair/BSC   Stand pivot transfers: Min assist, +2 physical assistance         General transfer comment: pt was able to transfer from bed to [facility] using bariatric RW and +2 min A    Ambulation/Gait               General Gait Details: did not assess during this session  Jerome [name]            Wheelchair Mobility     Tilt Bed    Modified Rankin (Stroke Patients Only)       Balance Overall balance assessment: Needs assistance Sitting-balance support: Feet supported, No upper extremity supported Sitting balance-Leahy Scale: Good Sitting balance - Comments: able to tolerate sitting EOB for approx 5 min while speaking with doctor and family member   Standing balance support: Bilateral upper extremity supported, Reliant on assistive device for balance, During functional activity Standing balance-Leahy Scale: Fair Standing balance comment: pt was able to tolerate a brief period of standing prior to transfering to [facility]                             Pertinent Vitals/Pain Pain Assessment Pain  Assessment: Faces Faces Pain Scale: Hurts a little bit Pain Location: R sided cramping intermittantly/with transitional movement -- gait belt high Pain Descriptors / Indicators: Cramping, Grimacing Pain Intervention(s): Monitored during session, Repositioned    Home Living Family/patient expects to be discharged to:: Private residence Living Arrangements: Non-relatives/Friends Available Help at Discharge: Friend(s);Family (has roommate, and home  services via [place] --- 13hrs/wk) Type of Home: Mobile home Home Access: Ramped entrance       Home Layout: One level Home Equipment: Agricultural Consultant (2 wheels);Wheelchair - manual;BSC/3in1      Prior Function Prior Level of Function : Independent/Modified Independent             Mobility Comments: pt reports being able to get around in the home okay ind with wc and RW -- needs to use RW when transfering to toilet due to lack of room to mobilize inside the home with wc ADLs Comments: ind     Extremity/Trunk Assessment        Lower Extremity Assessment Lower Extremity Assessment: Generalized weakness, chronic L knee injury and limited flexion as a result        Communication   Communication Communication: No apparent difficulties    Cognition Arousal: Alert Behavior During Therapy: WFL for tasks assessed/performed   PT - Cognitive impairments: No apparent impairments                         Following commands: Intact       Cueing Cueing Techniques: Verbal cues     General Comments General comments (skin integrity, edema, etc.): 2L [place] 89% at baseline and 87% with activity, pt experienced some R sided cramping with activity that resolved quickly, L LE swelling (chronic issue), difficultly with L knee flexion    Exercises     Assessment/Plan    PT Assessment Patient needs continued PT services  PT Problem List Decreased strength;Decreased range of motion;Decreased activity tolerance;Decreased balance;Decreased mobility;Decreased coordination;Decreased safety awareness;Obesity;Pain       PT Treatment Interventions DME instruction;Gait training;Stair training;Functional mobility training;Therapeutic activities;Therapeutic exercise;Balance training;Patient/family education    PT Goals (Current goals can be found in the Care Plan section)  Acute Rehab PT Goals Patient Stated Goal: getting back home PT Goal Formulation: With patient Time For Goal  Achievement: 06/01/24 Potential to Achieve Goals: Fair    Frequency Min 2X/week     Co-evaluation               AM-PAC PT 6 Clicks Mobility  Outcome Measure Help needed turning from your back to your side while in a flat bed without using bedrails?: A Little Help needed moving from lying on your back to sitting on the side of a flat bed without using bedrails?: A Little Help needed moving to and from a bed to a chair (including a wheelchair)?: A Lot Help needed standing up from a chair using your arms (e.g., wheelchair or bedside chair)?: A Lot Help needed to walk in hospital room?: A Lot Help needed climbing 3-5 steps with a railing? : A Lot 6 Click Score: 14    End of Session Equipment Utilized During Treatment: Gait belt Activity Tolerance: Patient tolerated treatment well Patient left:  (left on BSC with nursing present) and call bell within reach  Nurse Communication: Mobility status PT Visit Diagnosis: Unsteadiness on feet (R26.81);Muscle weakness (generalized) (M62.81);Pain Pain - part of body:  (abdominal area cramping)    Time: 9077-8998 PT Time Calculation (min) (ACUTE  ONLY): 39 min   Charges:                Corean Newport, SPT 05/19/24, 2:16 PM

## 2024-05-19 NOTE — Progress Notes (Signed)
 Heart Failure Navigator Progress Note  Assessed for Heart & Vascular TOC clinic readiness. Currently a patient in the [facility].  Patient has a Palliative Navigator from the [facility] that follows him. Per notes was told a new lesion was located in the liver concerning for metastatic disease.  Navigator available for reassessment of patient but will sign off at this time.  Charmaine Pines, RN, BSN [facility] Heart Failure Navigator Secure Chat Only

## 2024-05-19 NOTE — Progress Notes (Addendum)
 Progress Note    Jerome [name]  FMW:[id number] DOB: July 12, 1960  DOA: 05/16/2024 PCP: [facility]      Brief Narrative:    Medical records reviewed and are as summarized below:  Jerome [name] is a 63 y.o. 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  Reportedly, oxygen saturation was in the 50s on room air when EMS arrived.  He was placed on 15 L oxygen via nonrebreather mask and brought to the ED for further management.   Initial vital signs in the ED: Temperature 100.3 F, respiratory rate 18, pulse 109, BP 117/52, oxygen saturation 94% on 6 L oxygen.  proBNP 3,068 Venous blood gas showed pH 7.44, pCO2 62, pO2 97.   Chest x-ray IMPRESSION: 1. Possible bibasilar atelectasis or pneumonia and small right pleural effusion. Recommend PA and lateral views with a better inspiration when possible. 2. Interval cardiomegaly and pulmonary vascular congestion, accentuated by a poor inspiration. 3. Stable mild chronic interstitial lung disease and mild chronic bronchitic changes.   CTA chest IMPRESSION: 1. No evidence of pulmonary embolism. 2. 4.2 x 5.6 cm mass abutting the pleura over the right lower lobe laterally with adjacent 2.2 x 2.3 cm nodular density over the posterolateral right upper lobe adjacent the major fissure. 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 Recommend [facility]) for consideration of tissue sampling. 3. 2.5 cm hypodensity over the dome of the right lobe of the liver which appears to be continuous with the right lower lobe lung mass likely metastatic disease due to local invasion. 4. Mild-to-moderate  cardiomegaly. Atherosclerotic coronary artery disease. 5. Aortic atherosclerosis. 6. No acute findings in the abdomen/pelvis.   Aortic Atherosclerosis (ICD10-I70.0).       Assessment/Plan:   Principal Problem:   Acute hypoxemic respiratory failure (HCC) Active Problems:   Chronic pain syndrome   Class 3 obesity (HCC)   COPD with acute exacerbation (HCC)   OSA (obstructive sleep apnea)    Body mass index is 41.09 kg/m.  (Class II obesity)  S/p syncope: Probably this was due to acute respiratory failure.   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   Oxygen saturation dropped to 87%  with activity. Continue weaning attempts.  He may need oxygen at home He was previously on 7 L/min oxygen and BiPAP.   COPD exacerbation: Continue prednisone , bronchodilators and antibiotics.   Acute on chronic diastolic CHF, anasarca: Edema is improving.  Continue IV Lasix .  Monitor BMP, daily weight and urine output. Mild aortic stenosis 2D echo showed EF estimated at 50 to 55%, grade 1 diastolic dysfunction, mild aortic stenosis.   Elevated troponins: Troponin trend 105, 97 and 74.  This is likely due to demand ischemia   Hypokalemia: Improved.  Continue potassium repletion because of IV diuresis.   Acute metabolic encephalopathy: Mental status has improved.   Known non-small cell right lung cancer, chronic right-sided chest pain: New lesion in the liver concerning for metastatic disease. His oncologist is at the [facility] health system.  Chart review shows that he is on pembrolizumab and last dose was on 05/04/2024. He said he was supposed to  have an MRI for further evaluation of malignancy but he needs sedation before MRI can be done. He said he was told he would need nerve blockers but the MRI has to be done first.  He has been followed by palliative care team at the [facility]  system. Continue analgesics for pain.  (Methadone , oxycodone  as needed, IV morphine  as needed). Ordered Cymbalta  and Lyrica  which were recommended by his palliative care team at the [facility] Follow-up with inpatient palliative care team.   He is on low-dose Xarelto  for a clotting disorder   Type II DM with hyperglycemia: Continue Semglee  15 units nightly.  Use NovoLog  as needed for hyperglycemia.   General weakness: PT recommended discharge to SNF    Comorbidities include obesity, OSA on CPAP, tobacco use disorder, history of abdominal stents/mesenteric ischemia on Plavix    Plan of care was discussed with Bo, brother, with patient's consent (PT was at the bedside).  He was concerned about patient living at home by himself.  He said he has a 62 year old boy at home that helps from time to time but he is not always present.  He said patient should ideally go to a rehab facility but he has declined to go to rehab in the past.  He said he has mostly used a wheelchair to get around since he fractured his knee about 6 years ago. He said he has had discussions about obtaining medical power of attorney in the past.  I told him we could start the process in the hospital.  Patient is agreeable.  Chaplain has been consulted to assist   Diet Order             Diet Heart Room service appropriate? Yes; Fluid consistency: Thin  Diet effective now                                  Consultants: Palliative care  Procedures: None    Medications:    arformoterol   15 mcg Nebulization BID   And   umeclidinium bromide   1 puff Inhalation Daily   atorvastatin   80 mg Oral Daily   azithromycin   500 mg Oral Daily   clopidogrel   75 mg Oral Daily   DULoxetine   60 mg Oral Daily   furosemide   60 mg Intravenous Q12H   insulin  aspart  0-5 Units Subcutaneous QHS   insulin  aspart  0-9 Units Subcutaneous TID WC   insulin  aspart  4 Units Subcutaneous TID WC   insulin  glargine-yfgn  15  Units Subcutaneous QHS   ipratropium-albuterol   3 mL Nebulization BID   methadone   7.5 mg Oral Q8H   predniSONE   40 mg Oral Q breakfast   pregabalin   75 mg Oral BID   rivaroxaban   10 mg Oral Daily   Continuous Infusions:  cefTRIAXone  (ROCEPHIN )  IV 1 g (05/18/24 1804)     Anti-infectives (From admission, onward)    Start     Dose/Rate Route Frequency Ordered Stop   05/16/24 1800  cefTRIAXone  (ROCEPHIN ) 1 g in sodium chloride  0.9 % 100 mL IVPB        1 g 200 mL/hr over 30 Minutes Intravenous Every 24 hours 05/16/24 1742     05/16/24 1745  azithromycin  (ZITHROMAX ) tablet 500 mg        500 mg Oral Daily 05/16/24 1742     05/16/24 1030  ceFEPIme  (MAXIPIME ) 2 g in sodium chloride  0.9 % 100 mL  IVPB        2 g 200 mL/hr over 30 Minutes Intravenous  Once 05/16/24 1026 05/16/24 1211   05/16/24 1030  metroNIDAZOLE  (FLAGYL ) IVPB 500 mg        500 mg 100 mL/hr over 60 Minutes Intravenous  Once 05/16/24 1026 05/16/24 1211   05/16/24 1030  vancomycin  (VANCOCIN ) IVPB 1000 mg/200 mL premix        1,000 mg 200 mL/hr over 60 Minutes Intravenous  Once 05/16/24 1026 05/16/24 1404              Family Communication/Anticipated D/C date and plan/Code Status   DVT prophylaxis: rivaroxaban  (XARELTO ) tablet 10 mg Start: 05/17/24 1000 SCDs Start: 05/16/24 1735 rivaroxaban  (XARELTO ) tablet 10 mg     Code Status: Full Code  Family Communication:  Plan discussed with Bo (brother) in person in the hallway Disposition Plan: Plan to discharge home   Status is: Inpatient Remains inpatient appropriate because: Lung cancer, CHF exacerbation, COPD exacerbation and respiratory failure       Subjective:   Interval events noted.  He still complains of right upper quadrant abdominal pain.  Swelling in the legs has improved but the left leg is still swollen.  He said the left leg has always had some swelling since he shattered his left knee some years ago.  Objective:    Vitals:   05/19/24  0437 05/19/24 0500 05/19/24 0746 05/19/24 1150  BP: 138/66  124/63 (!) 111/53  Pulse: 73  65 81  Resp: 20  13 20   Temp: 98.3 F (36.8 C)  (!) 97.4 F (36.3 C) 97.8 F (36.6 C)  TempSrc:   Oral Oral  SpO2: 96%  95% 93%  Weight:  126.2 kg    Height:       No data found.   Intake/Output Summary (Last 24 hours) at 05/19/2024 1603 Last data filed at 05/19/2024 1407 Gross per 24 hour  Intake 720 ml  Output 3050 ml  Net -2330 ml   Filed Weights   05/16/24 1007 05/18/24 0500 05/19/24 0500  Weight: 122 kg 129 kg 126.2 kg    Exam:  GEN: NAD SKIN: Warm and dry EYES: No pallor or icterus ENT: MMM CV: RRR PULM: CTA B ABD: soft, obese, right upper quadrant tenderness, +BS CNS: AAO x 3, non focal EXT: Lower extremity edema is improving but he still has significant swelling in the left lower extremity from the thigh to the foot.       Data Reviewed:   I have personally reviewed following labs and imaging studies:  Labs: Labs show the following:   Basic Metabolic Panel: Recent Labs  Lab 05/16/24 1012 05/17/24 0207 05/18/24 0357 05/19/24 0409  NA 138 135 132* 135  K 4.0 3.4* 3.6 3.9  CL 94* 92* 91* 95*  CO2 36* 36* 35* 35*  GLUCOSE 159* 244* 109* 126*  BUN 19 17 16 16   CREATININE 0.56* 0.48* 0.39* 0.46*  CALCIUM  8.7* 8.5* 8.9 9.0  MG  --  1.9 2.0 2.0  PHOS  --   --   --  3.6   GFR Estimated Creatinine Clearance: 125.8 mL/min (A) (by C-G formula based on SCr of 0.46 mg/dL (L)). Liver Function Tests: Recent Labs  Lab 05/16/24 1012 05/19/24 0409  AST 55*  --   ALT 22  --   ALKPHOS 99  --   BILITOT 0.7  --   PROT 6.8  --   ALBUMIN 2.6* 2.7*   No  results for input(s): LIPASE, AMYLASE in the last 168 hours. No results for input(s): AMMONIA in the last 168 hours. Coagulation profile Recent Labs  Lab 05/16/24 1012  INR 1.2    CBC: Recent Labs  Lab 05/16/24 1012 05/18/24 0357  WBC 12.5* 9.0  NEUTROABS 10.7*  --   HGB 14.2 13.7  HCT 47.2  45.8  MCV 85.0 85.4  PLT 274 235   Cardiac Enzymes: No results for input(s): CKTOTAL, CKMB, CKMBINDEX, TROPONINI in the last 168 hours. BNP (last 3 results) Recent Labs    05/16/24 1400  PROBNP 3,068.0*   CBG: Recent Labs  Lab 05/18/24 1227 05/18/24 1622 05/18/24 2115 05/19/24 0742 05/19/24 1149  GLUCAP 138* 183* 284* 127* 211*   D-Dimer: No results for input(s): DDIMER in the last 72 hours. Hgb A1c: Recent Labs    05/16/24 2016  HGBA1C 7.5*   Lipid Profile: No results for input(s): CHOL, HDL, LDLCALC, TRIG, CHOLHDL, LDLDIRECT in the last 72 hours. Thyroid function studies: No results for input(s): TSH, T4TOTAL, T3FREE, THYROIDAB in the last 72 hours.  Invalid input(s): FREET3 Anemia work up: No results for input(s): VITAMINB12, FOLATE, FERRITIN, TIBC, IRON, RETICCTPCT in the last 72 hours. Sepsis Labs: Recent Labs  Lab 05/16/24 1012 05/16/24 1400 05/18/24 0357  WBC 12.5*  --  9.0  LATICACIDVEN 1.9 1.1  --     Microbiology Recent Results (from the past 240 hours)  Culture, blood (Routine x 2)     Status: None (Preliminary result)   Collection Time: 05/16/24 10:14 AM   Specimen: BLOOD  Result Value Ref Range Status   Specimen Description BLOOD BLOOD RIGHT ARM  Final   Special Requests   Final    BOTTLES DRAWN AEROBIC AND ANAEROBIC Blood Culture results may not be optimal due to an inadequate volume of blood received in culture bottles   Culture   Final    NO GROWTH 3 DAYS Performed at [facility], [address] [place], [place] [address]    Report Status PENDING  Incomplete  Resp panel by RT-PCR (RSV, Flu A&B, Covid) Anterior Nasal Swab     Status: None   Collection Time: 05/16/24 10:19 AM   Specimen: Anterior Nasal Swab  Result Value Ref Range Status   SARS Coronavirus 2 by RT PCR NEGATIVE NEGATIVE Final    Comment: (NOTE) SARS-CoV-2 target nucleic acids are NOT DETECTED.  The SARS-CoV-2 RNA  is generally detectable in upper respiratory specimens during the acute phase of infection. The lowest concentration of SARS-CoV-2 viral copies this assay can detect is 138 copies/mL. A negative result does not preclude SARS-Cov-2 infection and should not be used as the sole basis for treatment or other patient management decisions. A negative result may occur with  improper specimen collection/handling, submission of specimen other than nasopharyngeal swab, presence of viral mutation(s) within the areas targeted by this assay, and inadequate number of viral copies(<138 copies/mL). A negative result must be combined with clinical observations, patient history, and epidemiological information. The expected result is Negative.  Fact Sheet for Patients:  bloggercourse.com  Fact Sheet for Healthcare Providers:  seriousbroker.it  This test is no t yet approved or cleared by the [place]  FDA and  has been authorized for detection and/or diagnosis of SARS-CoV-2 by FDA under an Emergency Use Authorization (EUA). This EUA will remain  in effect (meaning this test can be used) for the duration of the COVID-19 declaration under Section 564(b)(1) of the Act, 21 U.S.C.section 360bbb-3(b)(1), unless  the authorization is terminated  or revoked sooner.       Influenza A by PCR NEGATIVE NEGATIVE Final   Influenza B by PCR NEGATIVE NEGATIVE Final    Comment: (NOTE) The Xpert Xpress SARS-CoV-2/FLU/RSV plus assay is intended as an aid in the diagnosis of influenza from Nasopharyngeal swab specimens and should not be used as a sole basis for treatment. Nasal washings and aspirates are unacceptable for Xpert Xpress SARS-CoV-2/FLU/RSV testing.  Fact Sheet for Patients: bloggercourse.com  Fact Sheet for Healthcare Providers: seriousbroker.it  This test is not yet approved or cleared by the  [place]  FDA and has been authorized for detection and/or diagnosis of SARS-CoV-2 by FDA under an Emergency Use Authorization (EUA). This EUA will remain in effect (meaning this test can be used) for the duration of the COVID-19 declaration under Section 564(b)(1) of the Act, 21 U.S.C. section 360bbb-3(b)(1), unless the authorization is terminated or revoked.     Resp Syncytial Virus by PCR NEGATIVE NEGATIVE Final    Comment: (NOTE) Fact Sheet for Patients: bloggercourse.com  Fact Sheet for Healthcare Providers: seriousbroker.it  This test is not yet approved or cleared by the [place]  FDA and has been authorized for detection and/or diagnosis of SARS-CoV-2 by FDA under an Emergency Use Authorization (EUA). This EUA will remain in effect (meaning this test can be used) for the duration of the COVID-19 declaration under Section 564(b)(1) of the Act, 21 U.S.C. section 360bbb-3(b)(1), unless the authorization is terminated or revoked.  Performed at [facility], [address] [place], [place] [address]   Respiratory (~20 pathogens) panel by PCR     Status: None   Collection Time: 05/16/24  8:16 PM   Specimen: Nasopharyngeal Swab; Respiratory  Result Value Ref Range Status   Adenovirus NOT DETECTED NOT DETECTED Final   Coronavirus 229E NOT DETECTED NOT DETECTED Final    Comment: (NOTE) The Coronavirus on the Respiratory Panel, DOES NOT test for the novel  Coronavirus (2019 nCoV)    Coronavirus HKU1 NOT DETECTED NOT DETECTED Final   Coronavirus NL63 NOT DETECTED NOT DETECTED Final   Coronavirus OC43 NOT DETECTED NOT DETECTED Final   Metapneumovirus NOT DETECTED NOT DETECTED Final   Rhinovirus / Enterovirus NOT DETECTED NOT DETECTED Final   Influenza A NOT DETECTED NOT DETECTED Final   Influenza B NOT DETECTED NOT DETECTED Final   Parainfluenza Virus 1 NOT DETECTED NOT DETECTED Final   Parainfluenza Virus 2  NOT DETECTED NOT DETECTED Final   Parainfluenza Virus 3 NOT DETECTED NOT DETECTED Final   Parainfluenza Virus 4 NOT DETECTED NOT DETECTED Final   Respiratory Syncytial Virus NOT DETECTED NOT DETECTED Final   Bordetella pertussis NOT DETECTED NOT DETECTED Final   Bordetella Parapertussis NOT DETECTED NOT DETECTED Final   Chlamydophila pneumoniae NOT DETECTED NOT DETECTED Final   Mycoplasma pneumoniae NOT DETECTED NOT DETECTED Final    Comment: Performed at [facility], [address] [place], [place] [address]  Culture, blood (Routine X 2) w Reflex to ID Panel     Status: None (Preliminary result)   Collection Time: 05/17/24  2:07 AM   Specimen: BLOOD  Result Value Ref Range Status   Specimen Description BLOOD RIGHT ANTECUBITAL  Final   Special Requests   Final    BOTTLES DRAWN AEROBIC AND ANAEROBIC Blood Culture adequate volume   Culture   Final    NO GROWTH 2 DAYS Performed at [facility], [address] [place], [place] [address]  Report Status PENDING  Incomplete    Procedures and diagnostic studies:  No results found.              LOS: 3 days   Ariani Seier  [facility]   Pager on [email or web address]. If 7PM-7AM, please contact night-coverage at [email or web address]     05/19/2024, 4:03 PM

## 2024-05-19 NOTE — Plan of Care (Signed)

## 2024-05-19 NOTE — Consult Note (Signed)
 Consultation Note Date: 05/19/2024   Patient Name: Jerome [name]  DOB: 01/04/1961  MRN: [id number]  Age / Sex: 63 y.o., male  PCP: [facility] Referring Physician: Jens Durand, MD  Reason for Consultation: Establishing goals of care   HPI/Brief Hospital Course: 63 y.o. 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  On EMS arrival to the home, SpO2 found to be in 50's with AMS  CTA chest revealing right upper lobe mass (known), right lobe liver mass (new finding) with concern for metastatic disease  Admitted and being treated for acute hypoxic and hypercapnic respiratory failure due to COPD and CHF exacerbation  Noted that Jerome [name] has been followed by pain management in the past for chronic low back pain  Palliative medicine was consulted for assisting with goals of care conversations.  Subjective:  Extensive chart review has been completed prior to meeting patient including labs, vital signs, imaging, progress notes, orders, and available advanced directive documents from current and previous encounters.  Visited with Jerome [name] at his bedside.  He is awake, alert and able to engage in conversations.  No family or visitors at bedside during time of visit.  Introduced myself as a publishing rights manager as a member of the palliative care team. Explained palliative medicine is specialized medical care for people living with serious illness. It focuses on providing relief from the symptoms and stress of a serious illness. The goal is to improve quality of life for both the patient and the family.   Jerome [name] familiar with palliative medicine service as he is followed by an outpatient palliative provider through  the [facility].  Jerome [name] provides a brief life review.  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  He currently lives in his home with a roommate.  He is currently widowed but has a close friend he considers family.  Through the [facility] he has a care aide in his home 4 days out of the week.  His roommate is also readily available in the home to assist with his care.  At baseline he transfers from bed to wheelchair and is primarily wheelchair-bound.  He requires assistance with most ADLs.  He remains being able to feed himself.  Jerome [name] able to verbalize his understanding of reason for hospitalization as well as events during hospital course.  Aware he was admitted due to significant hypoxia-reports he is unsure on how his oxygen levels became as low as they did.  He reports being compliant with his CPAP each night.  Mr. Hing reports currently undergoing cancer treatment through the [facility].  He shares he was scheduled recently for repeat imaging which was unable to be completed due to present hospitalization.  He shares his understanding of newly found lesion on liver likely metastatic disease.  He shares he is eager to follow-up with his oncologist at the [facility] to discuss further treatment options.  Assessed pain.  Jerome [name] shares pain is fairly controlled at the moment.  He shares primary team has added as needed IV morphine  for breakthrough pain.  Jerome [name] shares in collaboration with his outpatient palliative provider he was scheduled for an MRI for further imaging and details to  potentially explore nerve blocks for further pain management.  Jerome [name] explains he must require sedation for an MRI due to severity of his pain in legs and back.  He is unable to tolerate MRI without full sedation.  According to his nurse at [facility], authorization approved for MRI with sedation at [facility].  It appears this has been ordered but not yet scheduled.  Mr.  [name] shares he wishes for this to to continue to be his plan.  We discussed potential alternative treatment options but Jerome [name] wishes to continue follow-up and discussions with his palliative provider within the [facility].  We discussed patient's current illness and what it means in the larger context of patient's on-going co-morbidities. Natural disease trajectory and expectations at EOL were discussed.   Attempted to elicit goals of care.  Jerome [name] shares his completed advance directives appointing his brother Jerome [name] as Jerome [name].  He shares these documents have been provided to the [facility].  We discussed CODE STATUS and the difference between full code and DO NOT RESUSCITATE.  Jerome [name] shares he and his outpatient palliative provider have ongoing code and goals of care discussions.  Jerome [name] shares at this time he wishes to remain full code.  He further explains he would not be interested in long-term life preserving measures such as trach/PEG.  He remains interested in continuing to seek oncological treatment through the [facility] system.  Noted to have attempted full review of documents through [facility] but unable to review most care notes as they are unable to be uploaded. Last Palliative note confirms need for MRI of thoracic spine for potential consideration of pleurodesis if pain deemed pleuritic in nature. Team working with Jerome [name] for insurance approval and facility acceptance for MRI requiring sedation.  I discussed importance of continued conversations with family/support persons and all members of their medical team regarding overall plan of care and treatment options ensuring decisions are in alignment with patients goals of care.  All questions/concerns addressed. Emotional support provided to patient/family/support persons. PMT will continue to follow and support patient as needed.  Objective: Primary Diagnoses: Present on Admission:  Acute hypoxemic respiratory failure (HCC)  Chronic pain  syndrome   Physical Exam Constitutional:      General: He is not in acute distress.    Appearance: He is obese. He is ill-appearing.  HENT:     Head: Normocephalic.     Mouth/Throat:     Mouth: Mucous membranes are dry.  Pulmonary:     Effort: Pulmonary effort is normal. No respiratory distress.  Abdominal:     General: There is no distension.     Palpations: Abdomen is soft.     Tenderness: There is no abdominal tenderness.  Skin:    General: Skin is warm and dry.  Neurological:     Mental Status: He is alert and oriented to person, place, and time.     Motor: Weakness present.  Psychiatric:        Mood and Affect: Mood normal.        Behavior: Behavior normal.        Thought Content: Thought content normal.     Vital Signs: BP (!) 111/53 (BP Location: Right Arm)   Pulse 81   Temp 97.8 F (36.6 C) (Oral)   Resp 20   Ht 5' 9 (1.753 m)   Wt 126.2 kg   SpO2 93%   BMI 41.09 kg/m  Pain Scale: 0-10   Pain Score: 8  IO: Intake/output summary:  Intake/Output Summary (Last 24 hours) at 05/19/2024 1616 Last data filed at 05/19/2024 1407 Gross per 24 hour  Intake 720 ml  Output 3050 ml  Net -2330 ml    LBM: Last BM Date : 05/17/24 Baseline Weight: Weight: 122 kg Most recent weight: Weight: 126.2 kg      Assessment and Plan  SUMMARY OF RECOMMENDATIONS   Continue with current plan of care Full Code/Full Scope desired  Palliative Prophylaxis:   Bowel Regimen, Delirium Protocol and Frequent Pain Assessment  Thank you for this consult and allowing Palliative Medicine to participate in the care of Jerome [name]. Palliative medicine will continue to follow and assist as needed.   Visit includes: Detailed review of medical records (labs, imaging, vital signs), medically appropriate exam (mental status, respiratory, cardiac, skin), discussed with treatment team, counseling and educating patient, family and staff, documenting clinical information, medication  management and coordination of care.   Signed by: [name] Lesches, DNP, AGNP-C Palliative Medicine    Please contact Palliative Medicine Team phone at [phone] for questions and concerns.  For individual provider: See Tracey

## 2024-05-20 DIAGNOSIS — G893 Neoplasm related pain (acute) (chronic): Secondary | ICD-10-CM

## 2024-05-20 DIAGNOSIS — J9601 Acute respiratory failure with hypoxia: Secondary | ICD-10-CM | POA: Diagnosis not present

## 2024-05-20 LAB — BLOOD GAS, ARTERIAL
Acid-Base Excess: 11.1 mmol/L — ABNORMAL HIGH (ref 0.0–2.0)
Bicarbonate: 37.4 mmol/L — ABNORMAL HIGH (ref 20.0–28.0)
FIO2: 0.35 %
O2 Saturation: 97.1 %
Patient temperature: 37
pCO2 arterial: 55 mmHg — ABNORMAL HIGH (ref 32–48)
pH, Arterial: 7.44 (ref 7.35–7.45)
pO2, Arterial: 87 mmHg (ref 83–108)

## 2024-05-20 LAB — MAGNESIUM: Magnesium: 1.9 mg/dL (ref 1.7–2.4)

## 2024-05-20 LAB — BASIC METABOLIC PANEL WITH GFR
Anion gap: 6 (ref 5–15)
BUN: 17 mg/dL (ref 8–23)
CO2: 36 mmol/L — ABNORMAL HIGH (ref 22–32)
Calcium: 9.1 mg/dL (ref 8.9–10.3)
Chloride: 94 mmol/L — ABNORMAL LOW (ref 98–111)
Creatinine, Ser: 0.45 mg/dL — ABNORMAL LOW (ref 0.61–1.24)
GFR, Estimated: >60 mL/min (ref >60)
Glucose, Bld: 103 mg/dL — ABNORMAL HIGH (ref 70–99)
Potassium: 3.7 mmol/L (ref 3.5–5.1)
Sodium: 136 mmol/L (ref 135–145)

## 2024-05-20 LAB — CBC
HCT: 50.8 % (ref 39.0–52.0)
Hemoglobin: 14.9 g/dL (ref 13.0–17.0)
MCH: 25 pg — ABNORMAL LOW (ref 26.0–34.0)
MCHC: 29.3 g/dL — ABNORMAL LOW (ref 30.0–36.0)
MCV: 85.1 fL (ref 80.0–100.0)
Platelets: 250 K/uL (ref 150–400)
RBC: 5.97 MIL/uL — ABNORMAL HIGH (ref 4.22–5.81)
RDW: 18.6 % — ABNORMAL HIGH (ref 11.5–15.5)
WBC: 7.8 K/uL (ref 4.0–10.5)
nRBC: 0 % (ref 0.0–0.2)

## 2024-05-20 LAB — GLUCOSE, CAPILLARY
Glucose-Capillary: 112 mg/dL — ABNORMAL HIGH (ref 70–99)
Glucose-Capillary: 206 mg/dL — ABNORMAL HIGH (ref 70–99)
Glucose-Capillary: 192 mg/dL — ABNORMAL HIGH (ref 70–99)
Glucose-Capillary: 181 mg/dL — ABNORMAL HIGH (ref 70–99)

## 2024-05-20 MED ORDER — FUROSEMIDE 10 MG/ML IJ SOLN
20.0000 mg | Freq: Two times a day (BID) | INTRAMUSCULAR | Status: DC
  Administered 2024-05-20 – 2024-05-21 (×2): 20 mg via INTRAVENOUS
  Filled 2024-05-20 (×2): qty 2

## 2024-05-20 MED ORDER — MORPHINE SULFATE (CONCENTRATE) 10 MG /0.5 ML PO SOLN
5.0000 mg | ORAL | Status: DC
  Administered 2024-05-20 – 2024-05-21 (×7): 5 mg via ORAL
  Filled 2024-05-20 (×8): qty 0.5

## 2024-05-20 NOTE — Progress Notes (Signed)
 Daily Progress Note   Patient Name: Jerome [name]       Date: 05/20/2024 DOB: 04/08/1961  Age: 63 y.o. MRN#: [id number] Attending Physician: Jens Durand, MD Primary Care Physician: [facility] Admit Date: 05/16/2024  Reason for Consultation/Follow-up: Establishing goals of care  HPI/Brief Hospital Review:  63 y.o. 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   On EMS arrival to the home, SpO2 found to be in 50's with AMS   CTA chest revealing right upper lobe mass (known), right lobe liver mass (new finding) with concern for metastatic disease   Admitted and being treated for acute hypoxic and hypercapnic respiratory failure due to COPD and CHF exacerbation   Noted that Jerome [name] has been followed by pain management in the past for chronic low back pain   Palliative medicine was consulted for assisting with goals of care conversations.  Subjective: Extensive chart review has been completed prior to meeting patient including labs, vital signs, imaging, progress notes, orders, and available advanced directive documents from current and previous encounters.    Visited with Jerome [name] at his bedside.  He is awake, alert and able to engage in conversation.  Prior to my visit he recently transferred from bed to recliner with some distance walking in between.  Jerome [name] shares he felt well doing get without shortness of breath and it is noted oxygen saturations remained fairly stable--likely to require oxygen supplementation at home.  Jerome [name]  shares he is eager to return home.  He again discusses concern related to pain management.  He shares since being hospitalized he feels he is responding very well to IV morphine .  He describes his pain being located to his right chest at area of known malignancy.  He shares this is a sharp pain.  He shares this pain has been consistent and is not any worse prior to admission.  He has been working with his palliative provider through the [facility] for alternative pain options.  As noted previously and reviewed from [facility] provider notes, Jerome [name] is set up to have MRI with sedation  performed for further evaluation for potential pleurodesis.  Discussed potential pain relief from fentanyl patch with Jerome [name].  Jerome [name] is clear he is not interested in adjusting pain medications further including fentanyl patch as he would only like to discuss this with his palliative provider through the [facility].  Recommendations made to primary team to consider immediate release morphine  for breakthrough pain instead of oxycodone  as Jerome [name] shares he is no longer having relief from oxycodone .  Explained to Jerome [name] changes to pain medications would be for a short course at discharge if felt appropriate by primary team and he would need close follow-up with Palliative provider at [facility].  Answered and addressed all questions and concerns. PMT to continue to follow for ongoing needs and support.  Objective:  Physical Exam Constitutional:      General: He is not in acute distress.    Appearance: He is ill-appearing.  HENT:     Head: Normocephalic.     Mouth/Throat:     Mouth: Mucous membranes are dry.  Pulmonary:     Effort: Pulmonary effort is normal. No respiratory distress.  Abdominal:     Palpations: Abdomen is soft.     Tenderness: There is no abdominal tenderness.  Skin:    General: Skin is warm and dry.  Neurological:     Mental Status: He is alert and oriented to person, place, and time.     Motor: Weakness  present.  Psychiatric:        Mood and Affect: Mood normal.        Behavior: Behavior normal.        Thought Content: Thought content normal.             Vital Signs: BP 134/65 (BP Location: Right Arm)   Pulse 68   Temp 97.6 F (36.4 C)   Resp 18   Ht 5' 9 (1.753 m)   Wt 118.2 kg   SpO2 96%   BMI 38.48 kg/m  SpO2: SpO2: 96 % O2 Device: O2 Device: Room Air O2 Flow Rate: O2 Flow Rate (L/min): 3 L/min   Palliative Care Assessment & Plan   Assessment/Recommendation/Plan  Consider morphine  instead of oxycodone  for breakthrough pain PMT to continue to follow for ongoing needs and support  Care plan was discussed with primary team  Thank you for allowing the Palliative Medicine Team to assist in the care of this patient.  Visit includes: Detailed review of medical records (labs, imaging, vital signs), medically appropriate exam (mental status, respiratory, cardiac, skin), discussed with treatment team, counseling and educating patient, family and staff, documenting clinical information, medication management and coordination of care.  Jerome Lesches, DNP, AGNP-C Palliative Medicine   Please contact Palliative Medicine Team phone at [phone] for questions and concerns.

## 2024-05-20 NOTE — Progress Notes (Signed)
 SATURATION QUALIFICATIONS: (This note is used to comply with regulatory documentation for home oxygen)  Patient Saturations on Room Air at Rest = 88%  Patient Saturations on Room Air while Ambulating = 87%  Patient Saturations on 2 Liters of oxygen while Ambulating = 91%

## 2024-05-20 NOTE — Evaluation (Signed)
 Occupational Therapy Evaluation Patient Details Name: Jerome [name] MRN: [id number] DOB: 1960/07/11 Today's Date: 05/20/2024   History of Present Illness   Pt is a 63 y.o. 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 MD assessment includes: Acute resp faulure, hypoxemia, hypercapnia, COPD exacerbation, syncope and collapse secondary to acute resp failure.     Clinical Impressions Mr. Handler presents with generalized weakness, limited endurance, and pain. PTA, he lives in a one-story home, ramped entrance, with a roommate who is available to assist with care needs. Pt has a home health aide who comes 4x/wk, ~3 hrs/visit, to assist with showering, home management, etc. Pt denies any recent falls hx other than the syncope event to preceded this hospitalization. Pt uses a CPAP at night, no supplemental O2 during the day. He primarily uses a WC for ambulation, can use a RW for short distances and for getting into his bathroom. Pt reports regular 6-7/10 pain in his R chest, which occasionally shoots up to a sharp, stabbing 10/10 pain. During today's evaluation, pt is able to perform bed mobility and transfers w/o supplemental O2 and w/o physical assistance from therapist, while maintain O2 levels between 88-91%. Pt demonstrates considerable improvement in fxl mobility compared to yesterday's PT evaluation. Recommend ongoing OT while hospitalized. Pt appears to be near his baseline level of fxl mobility at present, anticipate he could DC home with assistance.      If plan is discharge home, recommend the following:   A little help with walking and/or transfers;A little help with bathing/dressing/bathroom;Assistance with cooking/housework;Help with stairs or ramp for entrance;Assist for transportation     Functional Status  Assessment   Patient has had a recent decline in their functional status and demonstrates the ability to make significant improvements in function in a reasonable and predictable amount of time.     Equipment Recommendations   None recommended by OT     Recommendations for Other Services   Other (comment) (Consider order for supplemental O2 at home?)     Precautions/Restrictions   Precautions Precautions: Fall Restrictions Weight Bearing Restrictions Per Provider Order: No     Mobility Bed Mobility Overal bed mobility: Modified Independent       Supine to sit: Modified independent (Device/Increase time)     General bed mobility comments: extended time and effort    Transfers Overall transfer level: Needs assistance Equipment used: Rolling walker (2 wheels) Transfers: Bed to chair/wheelchair/BSC   Stand pivot transfers: Supervision         General transfer comment: Pt able to transfer bed to recliner this date w/o any physical assistance from therapist      Balance Overall balance assessment: Needs assistance Sitting-balance support: Feet supported, No upper extremity supported Sitting balance-Leahy Scale: Good     Standing balance support: Bilateral upper extremity supported, Reliant on assistive device for balance, During functional activity Standing balance-Leahy Scale: Fair Standing balance comment: pt was able to tolerate a brief period of standing prior to transfering to recliner                           ADL either performed or assessed with clinical judgement   ADL  Vision         Perception         Praxis         Pertinent Vitals/Pain Pain Assessment Pain Assessment: 0-10 Pain Score: 6  Pain Location: medial aspect of R chest Pain Descriptors / Indicators: Sharp, Stabbing Pain Intervention(s): Limited activity within patient's tolerance, Repositioned,  Premedicated before session     Extremity/Trunk Assessment Upper Extremity Assessment Upper Extremity Assessment: Overall WFL for tasks assessed   Lower Extremity Assessment Lower Extremity Assessment: Generalized weakness;LLE deficits/detail LLE Deficits / Details: limited flexion, limited weightbearing       Communication Communication Communication: No apparent difficulties   Cognition Arousal: Alert Behavior During Therapy: WFL for tasks assessed/performed                                 Following commands: Intact       Cueing  General Comments      SpO2 89% at room air, 91% with [place] at 3L   Exercises     Shoulder Instructions      Home Living Family/patient expects to be discharged to:: Private residence Living Arrangements: Non-relatives/Friends Available Help at Discharge: Friend(s);Family Type of Home: Mobile home Home Access: Ramped entrance     Home Layout: One level     Bathroom Shower/Tub: Tub/shower unit;Sponge bathes at baseline   Bathroom Toilet: Standard Bathroom Accessibility: No   Home Equipment: Agricultural Consultant (2 wheels);Wheelchair - manual;BSC/3in1          Prior Functioning/Environment Prior Level of Function : Needs assist             Mobility Comments: Primarily uses WC, can ambulate very short distances with RW. Uses RW in bathroom -- WC unable to fit through door ADLs Comments: Able to transfer bed<>wc. Receives asst for bathing, driving, household tasks, cooking    OT Problem List: Decreased strength;Decreased range of motion;Decreased activity tolerance;Impaired balance (sitting and/or standing);Cardiopulmonary status limiting activity   OT Treatment/Interventions: Self-care/ADL training;Patient/family education;Balance training;Energy conservation;DME and/or AE instruction;Therapeutic exercise      OT Goals(Current goals can be found in the care plan section)   Acute Rehab OT Goals Patient Stated  Goal: to go home ASAP and to be rescheduled for MRI at [facility] OT Goal Formulation: With patient Time For Goal Achievement: 06/03/24 Potential to Achieve Goals: Good ADL Goals Pt Will Perform Grooming: with supervision;standing Pt Will Perform Upper Body Dressing: sitting;standing;with supervision Pt Will Transfer to Toilet: with supervision;regular height toilet;grab bars   OT Frequency:  Min 2X/week    Co-evaluation              AM-PAC OT 6 Clicks Daily Activity     Outcome Measure Help from another person eating meals?: None Help from another person taking care of personal grooming?: A Little Help from another person toileting, which includes using toliet, bedpan, or urinal?: A Little Help from another person bathing (including washing, rinsing, drying)?: A Lot Help from another person to put on and taking off regular upper body clothing?: A Little Help from another person to put on and taking off regular lower body clothing?: A Lot 6 Click Score: 17   End of Session Equipment Utilized During Treatment: Rolling walker (2 wheels)  Activity Tolerance: Patient tolerated treatment well Patient left: in chair;with call bell/phone within reach  OT Visit Diagnosis: Unsteadiness on feet (R26.81);Muscle weakness (generalized) (M62.81)  Time: 8987-8946 OT Time Calculation (min): 41 min Charges:  OT General Charges $OT Visit: 1 Visit OT Evaluation $OT Eval Moderate Complexity: 1 Mod OT Treatments $Self Care/Home Management : 23-37 mins Suzen Hock, PhD, MS, OTR/L 05/20/24, 11:19 AM

## 2024-05-20 NOTE — Progress Notes (Addendum)
 Progress Note    Jerome [name]  FMW:[id number] DOB: 21-Jun-1960  DOA: 05/16/2024 PCP: [facility]      Brief Narrative:    Medical records reviewed and are as summarized below:  Jerome [name] is a 63 y.o. 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  Reportedly, oxygen saturation was in the 50s on room air when EMS arrived.  He was placed on 15 L oxygen via nonrebreather mask and brought to the ED for further management.   Initial vital signs in the ED: Temperature 100.3 F, respiratory rate 18, pulse 109, BP 117/52, oxygen saturation 94% on 6 L oxygen.  proBNP 3,068 Venous blood gas showed pH 7.44, pCO2 62, pO2 97.   Chest x-ray IMPRESSION: 1. Possible bibasilar atelectasis or pneumonia and small right pleural effusion. Recommend PA and lateral views with a better inspiration when possible. 2. Interval cardiomegaly and pulmonary vascular congestion, accentuated by a poor inspiration. 3. Stable mild chronic interstitial lung disease and mild chronic bronchitic changes.   CTA chest IMPRESSION: 1. No evidence of pulmonary embolism. 2. 4.2 x 5.6 cm mass abutting the pleura over the right lower lobe laterally with adjacent 2.2 x 2.3 cm nodular density over the posterolateral right upper lobe adjacent the major fissure. 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 Recommend [facility]) for consideration of tissue sampling. 3. 2.5 cm hypodensity over the dome of the right lobe of the liver which appears to be continuous with the right lower lobe lung mass likely metastatic disease due to local invasion. 4. Mild-to-moderate  cardiomegaly. Atherosclerotic coronary artery disease. 5. Aortic atherosclerosis. 6. No acute findings in the abdomen/pelvis.   Aortic Atherosclerosis (ICD10-I70.0).       Assessment/Plan:   Principal Problem:   Acute hypoxemic respiratory failure (HCC) Active Problems:   Chronic pain syndrome   Class 3 obesity (HCC)   COPD with acute exacerbation (HCC)   OSA (obstructive sleep apnea)    Body mass index is 38.48 kg/m.  (Class II obesity)  S/p syncope: Probably this was due to acute respiratory failure.   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   Oxygen saturation was between 88 to 91% while working with OT. Pulse oximetry with ambulation checked by RN showed oxygen saturation of 88% on room air at rest, 87% on room air while ambulating and 91% on 2 L while ambulating. He will need home oxygen. ABG on 05/21/2019 2535% FiO2 showed pH 7.44, pCO2 55, pO2 87, bicarb 37.4 and 97.1% oxygen saturation. Recommended trilogy machine in place of his CPAP at discharge and he is agreeable.  TOC has been consulted to arrange for trilogy machine. Of note, he required BiPAP on admission and was transitioned to 7 L/min oxygen via [place].   COPD exacerbation: Plan to complete 5 days of IV ceftriaxone  and azithromycin  today. Plan to complete 5 days of steroids today. Continue bronchodilators.   Acute on chronic diastolic CHF, anasarca: Edema is improving.  Decrease IV Lasix  from 60 mg twice daily to 20 mg twice daily.  He can possibly be switched to oral Lasix  tomorrow. So far about  8.1 L of net fluid loss. Monitor BMP, daily weights and urine output. Mild aortic stenosis 2D echo showed EF estimated at 50 to 55%, grade 1 diastolic dysfunction, mild aortic stenosis.   Elevated troponins: Troponin trend 105, 97 and 74.  This is likely due to demand ischemia   Hypokalemia:  Improved.  Continue potassium repletion because of IV diuresis.   Acute metabolic encephalopathy: Mental status has improved.   Known non-small cell right lung cancer, chronic right-sided chest pain: New lesion in the liver concerning for metastatic disease. His oncologist is at the [facility] health system.  Chart review shows that he is on pembrolizumab and last dose was on 05/04/2024. He said he was supposed to have an MRI for further evaluation of malignancy but he needs sedation before MRI can be done. He said he was told he would need nerve blockers but the MRI has to be done first.  He has been followed by palliative care team at the [facility] system. Case was discussed with [place], NP with palliative care team.  She recommended liquid morphine  in place of IV morphine  to be used short-term at discharge. I discussed pain management with the patient.  We decided to try oral morphine  solution as an inpatient and discontinue IV morphine  in preparation for discharge since he will be getting IV morphine  at home.  He is agreeable. Continue Cymbalta , Lyrica , methadone  and oxycodone  for pain. He plans to follow-up with his palliative care team at the [facility] hospital   He is on low-dose Xarelto  for a clotting disorder   Type II DM with hyperglycemia: Continue Semglee  15 units nightly.  Use NovoLog  as needed for hyperglycemia.   General weakness: PT and OT recommended discharge to SNF.  However, patient frequently declines to go to SNF.  He understands that he is at risk for falls at home.    Comorbidities include obesity, OSA on CPAP, tobacco use disorder, history of abdominal stents/mesenteric ischemia on Plavix    Diet Order             Diet Heart Room service appropriate? Yes; Fluid consistency: Thin  Diet effective now                                  Consultants: Palliative care  Procedures: None    Medications:    arformoterol   15 mcg Nebulization BID   And    umeclidinium bromide   1 puff Inhalation Daily   atorvastatin   80 mg Oral Daily   azithromycin   500 mg Oral Daily   clopidogrel   75 mg Oral Daily   DULoxetine   60 mg Oral Daily   furosemide   20 mg Intravenous Q12H   insulin  aspart  0-5 Units Subcutaneous QHS   insulin  aspart  0-9 Units Subcutaneous TID WC   insulin  aspart  4 Units Subcutaneous TID WC   insulin  glargine-yfgn  15 Units Subcutaneous QHS   ipratropium-albuterol   3 mL Nebulization BID   methadone   7.5 mg Oral Q8H   potassium chloride   40 mEq Oral Daily   predniSONE   40 mg Oral Q breakfast   pregabalin   75 mg Oral BID   rivaroxaban   10 mg Oral Daily   Continuous Infusions:  cefTRIAXone  (ROCEPHIN )  IV 1 g (05/19/24 1756)     Anti-infectives (From admission, onward)    Start     Dose/Rate Route Frequency Ordered Stop   05/16/24 1800  cefTRIAXone  (ROCEPHIN )  1 g in sodium chloride  0.9 % 100 mL IVPB        1 g 200 mL/hr over 30 Minutes Intravenous Every 24 hours 05/16/24 1742     05/16/24 1745  azithromycin  (ZITHROMAX ) tablet 500 mg        500 mg Oral Daily 05/16/24 1742     05/16/24 1030  ceFEPIme  (MAXIPIME ) 2 g in sodium chloride  0.9 % 100 mL IVPB        2 g 200 mL/hr over 30 Minutes Intravenous  Once 05/16/24 1026 05/16/24 1211   05/16/24 1030  metroNIDAZOLE  (FLAGYL ) IVPB 500 mg        500 mg 100 mL/hr over 60 Minutes Intravenous  Once 05/16/24 1026 05/16/24 1211   05/16/24 1030  vancomycin  (VANCOCIN ) IVPB 1000 mg/200 mL premix        1,000 mg 200 mL/hr over 60 Minutes Intravenous  Once 05/16/24 1026 05/16/24 1404              Family Communication/Anticipated D/C date and plan/Code Status   DVT prophylaxis: rivaroxaban  (XARELTO ) tablet 10 mg Start: 05/17/24 1000 SCDs Start: 05/16/24 1735 rivaroxaban  (XARELTO ) tablet 10 mg     Code Status: Full Code  Family Communication:  None Disposition Plan: Plan to discharge home   Status is: Inpatient Remains inpatient appropriate because: Lung cancer, CHF  exacerbation, COPD exacerbation and respiratory failure       Subjective:   Interval events noted.  He feels better.  No shortness of breath or chest pain.  Swelling in the right lower extremity has improved significantly.  Swelling in the left lower extremity has improved as well but it is still swollen.  However, he reported he has had chronic swelling in his left leg and left foot since his left knee injury.  Objective:    Vitals:   05/20/24 0436 05/20/24 0500 05/20/24 0809 05/20/24 1157  BP: 133/68  113/73 133/67  Pulse: 69  79 77  Resp: 20  20 18   Temp: 97.8 F (36.6 C)  (!) 97.5 F (36.4 C) 98.1 F (36.7 C)  TempSrc:      SpO2: 92%  91% 93%  Weight:  118.2 kg    Height:       No data found.   Intake/Output Summary (Last 24 hours) at 05/20/2024 1516 Last data filed at 05/20/2024 0816 Gross per 24 hour  Intake 480 ml  Output 2500 ml  Net -2020 ml   Filed Weights   05/18/24 0500 05/19/24 0500 05/20/24 0500  Weight: 129 kg 126.2 kg 118.2 kg    Exam:  GEN: NAD SKIN: Warm and dry EYES: No pallor or icterus ENT: MMM CV: RRR PULM: CTA B ABD: soft, obese, NT, +BS CNS: AAO x 3, non focal EXT: Edema in the right lower extremity has improved.  He still has edema in the left lower extremity from the knee to the foot.       Data Reviewed:   I have personally reviewed following labs and imaging studies:  Labs: Labs show the following:   Basic Metabolic Panel: Recent Labs  Lab 05/16/24 1012 05/17/24 0207 05/18/24 0357 05/19/24 0409 05/20/24 0431  NA 138 135 132* 135 136  K 4.0 3.4* 3.6 3.9 3.7  CL 94* 92* 91* 95* 94*  CO2 36* 36* 35* 35* 36*  GLUCOSE 159* 244* 109* 126* 103*  BUN 19 17 16 16 17   CREATININE 0.56* 0.48* 0.39* 0.46* 0.45*  CALCIUM  8.7* 8.5* 8.9 9.0 9.1  MG  --  1.9 2.0 2.0 1.9  PHOS  --   --   --  3.6  --    GFR Estimated Creatinine Clearance: 121.5 mL/min (A) (by C-G formula based on SCr of 0.45 mg/dL (L)). Liver Function  Tests: Recent Labs  Lab 05/16/24 1012 05/19/24 0409  AST 55*  --   ALT 22  --   ALKPHOS 99  --   BILITOT 0.7  --   PROT 6.8  --   ALBUMIN 2.6* 2.7*   No results for input(s): LIPASE, AMYLASE in the last 168 hours. No results for input(s): AMMONIA in the last 168 hours. Coagulation profile Recent Labs  Lab 05/16/24 1012  INR 1.2    CBC: Recent Labs  Lab 05/16/24 1012 05/18/24 0357 05/20/24 0431  WBC 12.5* 9.0 7.8  NEUTROABS 10.7*  --   --   HGB 14.2 13.7 14.9  HCT 47.2 45.8 50.8  MCV 85.0 85.4 85.1  PLT 274 235 250   Cardiac Enzymes: No results for input(s): CKTOTAL, CKMB, CKMBINDEX, TROPONINI in the last 168 hours. BNP (last 3 results) Recent Labs    05/16/24 1400  PROBNP 3,068.0*   CBG: Recent Labs  Lab 05/19/24 1149 05/19/24 1632 05/19/24 2032 05/20/24 0811 05/20/24 1158  GLUCAP 211* 255* 242* 112* 206*   D-Dimer: No results for input(s): DDIMER in the last 72 hours. Hgb A1c: No results for input(s): HGBA1C in the last 72 hours.  Lipid Profile: No results for input(s): CHOL, HDL, LDLCALC, TRIG, CHOLHDL, LDLDIRECT in the last 72 hours. Thyroid function studies: No results for input(s): TSH, T4TOTAL, T3FREE, THYROIDAB in the last 72 hours.  Invalid input(s): FREET3 Anemia work up: No results for input(s): VITAMINB12, FOLATE, FERRITIN, TIBC, IRON, RETICCTPCT in the last 72 hours. Sepsis Labs: Recent Labs  Lab 05/16/24 1012 05/16/24 1400 05/18/24 0357 05/20/24 0431  WBC 12.5*  --  9.0 7.8  LATICACIDVEN 1.9 1.1  --   --     Microbiology Recent Results (from the past 240 hours)  Culture, blood (Routine x 2)     Status: None (Preliminary result)   Collection Time: 05/16/24 10:14 AM   Specimen: BLOOD  Result Value Ref Range Status   Specimen Description BLOOD BLOOD RIGHT ARM  Final   Special Requests   Final    BOTTLES DRAWN AEROBIC AND ANAEROBIC Blood Culture results may not be optimal  due to an inadequate volume of blood received in culture bottles   Culture   Final    NO GROWTH 4 DAYS Performed at [facility], [address], [place], [place] [address]    Report Status PENDING  Incomplete  Resp panel by RT-PCR (RSV, Flu A&B, Covid) Anterior Nasal Swab     Status: None   Collection Time: 05/16/24 10:19 AM   Specimen: Anterior Nasal Swab  Result Value Ref Range Status   SARS Coronavirus 2 by RT PCR NEGATIVE NEGATIVE Final    Comment: (NOTE) SARS-CoV-2 target nucleic acids are NOT DETECTED.  The SARS-CoV-2 RNA is generally detectable in upper respiratory specimens during the acute phase of infection. The lowest concentration of SARS-CoV-2 viral copies this assay can detect is 138 copies/mL. A negative result does not preclude SARS-Cov-2 infection and should not be used as the sole basis for treatment or other patient management decisions. A negative result may occur with  improper specimen collection/handling, submission of specimen other than nasopharyngeal swab, presence of viral mutation(s) within the areas targeted by this assay,  and inadequate number of viral copies(<138 copies/mL). A negative result must be combined with clinical observations, patient history, and epidemiological information. The expected result is Negative.  Fact Sheet for Patients:  bloggercourse.com  Fact Sheet for Healthcare Providers:  seriousbroker.it  This test is no t yet approved or cleared by the [place]  FDA and  has been authorized for detection and/or diagnosis of SARS-CoV-2 by FDA under an Emergency Use Authorization (EUA). This EUA will remain  in effect (meaning this test can be used) for the duration of the COVID-19 declaration under Section 564(b)(1) of the Act, 21 U.S.C.section 360bbb-3(b)(1), unless the authorization is terminated  or revoked sooner.       Influenza A by PCR NEGATIVE NEGATIVE Final    Influenza B by PCR NEGATIVE NEGATIVE Final    Comment: (NOTE) The Xpert Xpress SARS-CoV-2/FLU/RSV plus assay is intended as an aid in the diagnosis of influenza from Nasopharyngeal swab specimens and should not be used as a sole basis for treatment. Nasal washings and aspirates are unacceptable for Xpert Xpress SARS-CoV-2/FLU/RSV testing.  Fact Sheet for Patients: bloggercourse.com  Fact Sheet for Healthcare Providers: seriousbroker.it  This test is not yet approved or cleared by the [place]  FDA and has been authorized for detection and/or diagnosis of SARS-CoV-2 by FDA under an Emergency Use Authorization (EUA). This EUA will remain in effect (meaning this test can be used) for the duration of the COVID-19 declaration under Section 564(b)(1) of the Act, 21 U.S.C. section 360bbb-3(b)(1), unless the authorization is terminated or revoked.     Resp Syncytial Virus by PCR NEGATIVE NEGATIVE Final    Comment: (NOTE) Fact Sheet for Patients: bloggercourse.com  Fact Sheet for Healthcare Providers: seriousbroker.it  This test is not yet approved or cleared by the [place]  FDA and has been authorized for detection and/or diagnosis of SARS-CoV-2 by FDA under an Emergency Use Authorization (EUA). This EUA will remain in effect (meaning this test can be used) for the duration of the COVID-19 declaration under Section 564(b)(1) of the Act, 21 U.S.C. section 360bbb-3(b)(1), unless the authorization is terminated or revoked.  Performed at [facility], [address] [place], [place] [address]   Respiratory (~20 pathogens) panel by PCR     Status: None   Collection Time: 05/16/24  8:16 PM   Specimen: Nasopharyngeal Swab; Respiratory  Result Value Ref Range Status   Adenovirus NOT DETECTED NOT DETECTED Final   Coronavirus 229E NOT DETECTED NOT DETECTED Final     Comment: (NOTE) The Coronavirus on the Respiratory Panel, DOES NOT test for the novel  Coronavirus (2019 nCoV)    Coronavirus HKU1 NOT DETECTED NOT DETECTED Final   Coronavirus NL63 NOT DETECTED NOT DETECTED Final   Coronavirus OC43 NOT DETECTED NOT DETECTED Final   Metapneumovirus NOT DETECTED NOT DETECTED Final   Rhinovirus / Enterovirus NOT DETECTED NOT DETECTED Final   Influenza A NOT DETECTED NOT DETECTED Final   Influenza B NOT DETECTED NOT DETECTED Final   Parainfluenza Virus 1 NOT DETECTED NOT DETECTED Final   Parainfluenza Virus 2 NOT DETECTED NOT DETECTED Final   Parainfluenza Virus 3 NOT DETECTED NOT DETECTED Final   Parainfluenza Virus 4 NOT DETECTED NOT DETECTED Final   Respiratory Syncytial Virus NOT DETECTED NOT DETECTED Final   Bordetella pertussis NOT DETECTED NOT DETECTED Final   Bordetella Parapertussis NOT DETECTED NOT DETECTED Final   Chlamydophila pneumoniae NOT DETECTED NOT DETECTED Final   Mycoplasma pneumoniae NOT DETECTED NOT DETECTED Final  Comment: Performed at [facility], [address], [place], [place] [address]  Culture, blood (Routine X 2) w Reflex to ID Panel     Status: None (Preliminary result)   Collection Time: 05/17/24  2:07 AM   Specimen: BLOOD  Result Value Ref Range Status   Specimen Description BLOOD RIGHT ANTECUBITAL  Final   Special Requests   Final    BOTTLES DRAWN AEROBIC AND ANAEROBIC Blood Culture adequate volume   Culture   Final    NO GROWTH 3 DAYS Performed at [facility], [address] [place], [place] [address]    Report Status PENDING  Incomplete    Procedures and diagnostic studies:  No results found.              LOS: 4 days   Yaritzi Craun  [facility]   Pager on [email or web address]. If 7PM-7AM, please contact night-coverage at [email or web address]     05/20/2024, 3:16 PM

## 2024-05-20 NOTE — Plan of Care (Signed)

## 2024-05-21 DIAGNOSIS — J9601 Acute respiratory failure with hypoxia: Secondary | ICD-10-CM | POA: Diagnosis not present

## 2024-05-21 LAB — RENAL FUNCTION PANEL
Albumin: 2.7 g/dL — ABNORMAL LOW (ref 3.5–5.0)
Anion gap: 7 (ref 5–15)
BUN: 15 mg/dL (ref 8–23)
CO2: 34 mmol/L — ABNORMAL HIGH (ref 22–32)
Calcium: 9.1 mg/dL (ref 8.9–10.3)
Chloride: 94 mmol/L — ABNORMAL LOW (ref 98–111)
Creatinine, Ser: 0.37 mg/dL — ABNORMAL LOW (ref 0.61–1.24)
GFR, Estimated: >60 mL/min (ref >60)
Glucose, Bld: 96 mg/dL (ref 70–99)
Phosphorus: 3.2 mg/dL (ref 2.5–4.6)
Potassium: 3.8 mmol/L (ref 3.5–5.1)
Sodium: 135 mmol/L (ref 135–145)

## 2024-05-21 LAB — MAGNESIUM: Magnesium: 1.9 mg/dL (ref 1.7–2.4)

## 2024-05-21 LAB — CULTURE, BLOOD (ROUTINE X 2): Culture: NO GROWTH

## 2024-05-21 LAB — GLUCOSE, CAPILLARY
Glucose-Capillary: 95 mg/dL (ref 70–99)
Glucose-Capillary: 268 mg/dL — ABNORMAL HIGH (ref 70–99)

## 2024-05-21 MED ORDER — FUROSEMIDE 40 MG PO TABS: 40.0000 mg | ORAL_TABLET | Freq: Every day | ORAL | 0 refills | Status: AC

## 2024-05-21 MED ORDER — NALOXONE HCL 4 MG/0.1ML NA LIQD: NASAL | 0 refills | Status: DC

## 2024-05-21 MED ORDER — FUROSEMIDE 40 MG PO TABS: 40.0000 mg | ORAL_TABLET | Freq: Every day | ORAL | 0 refills | Status: DC

## 2024-05-21 MED ORDER — NALOXONE HCL 4 MG/0.1ML NA LIQD: NASAL | 0 refills | Status: AC

## 2024-05-21 MED ORDER — MORPHINE SULFATE (CONCENTRATE) 10 MG /0.5 ML PO SOLN: 5.0000 mg | ORAL | 0 refills | Status: DC | PRN

## 2024-05-21 MED ORDER — MORPHINE SULFATE (CONCENTRATE) 10 MG /0.5 ML PO SOLN: 5.0000 mg | ORAL | 0 refills | Status: AC | PRN

## 2024-05-21 NOTE — Progress Notes (Signed)
 Nsg Discharge Note  Admit Date:  05/16/2024 Discharge date: 05/21/2024   Charlie VEAR [place] to be D/C'd Home per MD order.  AVS completed.  Copy for chart, and copy for patient signed, and dated. Patient/caregiver able to verbalize understanding.  Discharge Medication: Allergies as of 05/21/2024   No Known Allergies      Medication List     PAUSE taking these medications    lisinopril 20 MG tablet Wait to take this until your doctor or other care provider tells you to start again. Commonly known as: ZESTRIL Take 20 mg by mouth daily.       STOP taking these medications    clopidogrel  75 MG tablet Commonly known as: PLAVIX    oxyCODONE  5 MG immediate release tablet Commonly known as: Oxy IR/ROXICODONE    zolpidem 10 MG tablet Commonly known as: AMBIEN       TAKE these medications    acetaminophen  325 MG tablet Commonly known as: TYLENOL  Take 650 mg by mouth every 6 (six) hours as needed for mild pain (pain score 1-3) or moderate pain (pain score 4-6).   albuterol  108 (90 Base) MCG/ACT inhaler Commonly known as: VENTOLIN  HFA Inhale into the lungs.   aspirin EC 81 MG tablet Take 81 mg by mouth daily. Swallow whole.   atorvastatin  80 MG tablet Commonly known as: LIPITOR  Take 80 mg by mouth daily.   bisacodyl 5 MG EC tablet Commonly known as: DULCOLAX Take 5 mg by mouth 2 (two) times daily as needed for moderate constipation.   DULoxetine  60 MG capsule Commonly known as: CYMBALTA  Take 60 mg by mouth daily.   Empagliflozin-metFORMIN HCl 12.10-998 MG Tabs Take 2 tablets by mouth every morning.   furosemide  40 MG tablet Commonly known as: Lasix  Take 1 tablet (40 mg total) by mouth daily.   hydrocortisone 2.5 % lotion Apply 1 Application topically 3 (three) times daily as needed (itching).   insulin  aspart 100 UNIT/ML injection Commonly known as: novoLOG  Inject 5 Units into the skin daily as needed for high blood sugar.   insulin  glargine 100 UNIT/ML  injection Commonly known as: LANTUS  Inject 30 Units into the skin daily.   lidocaine 5 % Commonly known as: LIDODERM Place 1 patch onto the skin daily.   methadone  5 MG tablet Commonly known as: DOLOPHINE  Take 7.5 mg by mouth 3 (three) times daily.   morphine  CONCENTRATE 10 mg / 0.5 ml concentrated solution Take 0.25 mLs (5 mg total) by mouth every 4 (four) hours as needed for up to 5 days for severe pain (pain score 7-10).   naloxone  4 MG/0.1ML Liqd nasal spray kit Commonly known as: NARCAN  Take incase of overdose   nicotine polacrilex 4 MG lozenge Commonly known as: COMMIT Take 4 mg by mouth as needed for smoking cessation.   OLANZapine 10 MG tablet Commonly known as: ZYPREXA TAKE ONE-HALF TABLET BY MOUTH ONCE EVERY DAY   ondansetron 8 MG tablet Commonly known as: ZOFRAN Take 8 mg by mouth every 8 (eight) hours as needed.   polyethylene glycol 17 g packet Commonly known as: MIRALAX / GLYCOLAX Take 17 g by mouth.  Take 1 packet (17 g total) by mouth once daily Mix in 4-8ounces of fluid prior to taking.   pregabalin  75 MG capsule Commonly known as: LYRICA  Take 75 mg by mouth daily. Patient has not started medication at this time   prochlorperazine 5 MG tablet Commonly known as: COMPAZINE Take 5 mg by mouth every 8 (eight) hours as  needed for nausea.   rivaroxaban  10 MG Tabs tablet Commonly known as: XARELTO  Take 10 mg by mouth daily.   senna 8.6 MG Tabs tablet Commonly known as: SENOKOT Take 1 tablet by mouth.   senna-docusate 8.6-50 MG tablet Commonly known as: Senokot-S Take by mouth.  TAKE 2 TABLETS BY MOUTH TWO TIMES A DAY TO PREVENT CONSTIPATION   Tiotropium Bromide-Olodaterol 2.5-2.5 MCG/ACT Aers Inhale 2 puffs into the lungs daily at 12 noon.   varenicline 1 MG tablet Commonly known as: CHANTIX Take 1 mg by mouth 2 (two) times daily.               Durable Medical Equipment  (From admission, onward)           Start     Ordered    05/21/24 1250  For home use only DME oxygen  Once       Question Answer Comment  Length of Need 12 Months   Mode or (Route) Nasal cannula   Liters per Minute 2   Oxygen delivery system: Gas   Oxygen delivery system: Concentrator      05/21/24 1250            Discharge Assessment: Vitals:   05/21/24 0804 05/21/24 1228  BP: 121/66 125/67  Pulse: 71 85  Resp: 18 20  Temp: (!) 97.5 F (36.4 C) 97.6 F (36.4 C)  SpO2: 99% 90%   Skin clean, dry and intact without evidence of skin break down, no evidence of skin tears noted. IV catheter discontinued intact. Site without signs and symptoms of complications - no redness or edema noted at insertion site, patient denies c/o pain - only slight tenderness at site.  Dressing with slight pressure applied.  D/c Instructions-Education: Discharge instructions given to patient/family with verbalized understanding. D/c education completed with patient/family including follow up instructions, medication list, d/c activities limitations if indicated, with other d/c instructions as indicated by MD - patient able to verbalize understanding, all questions fully answered. Pt verbalizes understanding to pick [facility] his med[facility] at the [facility] today, and not to double up on pain medication/narcotics.  Patient instructed to return to ED, call 911, or call MD for any changes in condition.  Patient escorted via WC, and D/C home via private auto.  Almarie DELENA Garret, RN 05/21/2024 4:16 PM

## 2024-05-21 NOTE — Discharge Instructions (Signed)
 Follow up with PCP and pick up medications from pharmacy. Make sure to take all antibiotics. Do not double up on narcotic/pain medication or drink alcohol during this time. Do not drive while taking narcotic medication. Call 911 or return to ER for life threatening issues or other concerns.

## 2024-05-21 NOTE — Plan of Care (Signed)

## 2024-05-21 NOTE — TOC Progression Note (Signed)
 Transition of Care [facility]) - Progression Note    Patient Details  Name: Jerome [name] MRN: [id number] Date of Birth: 07/02/1960  [facility] CM/SW Contact  Victory Jackquline RAMAN, RN Phone Number: 05/21/2024, 3:25 PM  Clinical Narrative:   1250: RNCM received a message from the MD via a secure chat informing me that she had just placed an order for the patient to have oxygen. RNCM informed the MD that the patient has [employer] medical benefits and it would have to be order tomorrow through the [place]. RNCM will continue to follow for discharge planning needs.  1516: RNCM received a message from the MD informing me that the patient was leaving and that he didn't want to wait to get the oxygen. RNCM signing off.                     Expected Discharge Plan and Services         Expected Discharge Date: 05/21/24                                     Social Drivers of Health (SDOH) Interventions SDOH Screenings   Food Insecurity: No Food Insecurity (05/17/2024)  Housing: Low Risk  (05/17/2024)  Transportation Needs: No Transportation Needs (05/17/2024)  Utilities: Not At Risk (05/17/2024)  Depression (PHQ2-9): Low Risk  (05/25/2023)  Social Connections: Unknown (10/28/2021)   Received from [facility]  Tobacco Use: High Risk (05/16/2024)    Readmission Risk Interventions     No data to display

## 2024-05-21 NOTE — Progress Notes (Signed)
 Daily Progress Note   Patient Name: Jerome [name]       Date: 05/21/2024 DOB: [birth year]  Age: 63 y.o. MRN#: [id number] Attending Physician: Leesa Kast, DO Primary Care Physician: [facility] Admit Date: 05/16/2024  Reason for Consultation/Follow-up: Establishing goals of care  HPI/Brief Hospital Review: 63 y.o. 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   On EMS arrival to the home, SpO2 found to be in 50's with AMS   CTA chest revealing right upper lobe mass (known), right lobe liver mass (new finding) with concern for metastatic disease   Admitted and being treated for acute hypoxic and hypercapnic respiratory failure due to COPD and CHF exacerbation   Noted that Mr. Harte has been followed by pain management in the past for chronic low back pain   Palliative medicine was consulted for assisting with goals of care conversations. Subjective: Extensive chart review has been completed prior to meeting patient including labs, vital signs, imaging, progress notes, orders, and available advanced directive documents from current and previous encounters.    Visited with Mr. Shedden at his bedside. He is awake, alert and able to engage in conversation. He is up sitting in chair. He reports feeling well today.  We discussed pain medication. He feels he continues to have an improved relief from Morphine  versus Oxycodone . He shares it has enabled to him to get a few hours of uninterrupted sleep at night that he was not able to get PTA.  Mr.  Hawthorne is very eager to discharge home. Assured Mr. [place], primary team would round soon and discussions surrounding discharge would be had at that time.  Answered and addressed all questions and concerns. PMT to continue to follow for ongoing needs and support.   Objective:  Physical Exam Constitutional:      General: He is not in acute distress.    Appearance: He is obese. He is ill-appearing.  HENT:     Head: Normocephalic.     Mouth/Throat:     Mouth: Mucous membranes are dry.  Pulmonary:     Effort: Pulmonary effort is  normal. No respiratory distress.  Abdominal:     General: There is no distension.  Skin:    General: Skin is warm and dry.  Neurological:     Mental Status: He is alert and oriented to person, place, and time.     Motor: Weakness present.  Psychiatric:        Mood and Affect: Mood normal.        Behavior: Behavior normal.        Thought Content: Thought content normal.             Vital Signs: BP 125/67 (BP Location: Left Arm)   Pulse 85   Temp 97.6 F (36.4 C)   Resp 20   Ht 5' 9 (1.753 m)   Wt 118.4 kg   SpO2 90%   BMI 38.55 kg/m  SpO2: SpO2: 90 % O2 Device: O2 Device: Nasal Cannula O2 Flow Rate: O2 Flow Rate (L/min): 3 L/min   Palliative Care Assessment & Plan   Assessment/Recommendation/Plan  Continue with current plan of care  Thank you for allowing the Palliative Medicine Team to assist in the care of this patient.  Visit includes: Detailed review of medical records (labs, imaging, vital signs), medically appropriate exam (mental status, respiratory, cardiac, skin), discussed with treatment team, counseling and educating patient, family and staff, documenting clinical information, medication management and coordination of care.  [name], DNP, AGNP-C Palliative Medicine   Please contact Palliative Medicine Team phone at [phone] for questions and concerns.

## 2024-05-21 NOTE — Discharge Summary (Signed)
 DISCHARGE SUMMARY    Jerome [name] FMW:[id number] DOB: [birth year] DOA: 05/16/2024  PCP: [facility]  Admit date: 05/16/2024 Discharge date: 05/21/2024   Recommendations for Outpatient Follow-up:  [facility] and [facility] care team.   Hospital Course: Jerome [name] is a 63 year old with right lung cancer under the care of oncology at the [facility], COPD, OSA, type 2 diabetes, hypertension, dyslipidemia, aortic stenosis, clotting disorder on low-dose Xarelto , was brought to the hospital due to syncope, confusion, hypoxia.  O2 sat was 50% on room air when EMS arrived.  He was placed on 15 L nonrebreather and brought to ED for further management.  In the ED temperature 100.3, respiratory rate 18, pulse 109, O2 sat 94% on 6 L.  proBNP 3000.  Initial CXR concerning for effusions and pneumonia.  CTA chest revealed known lung cancer as well as a 2.5 cm hypodensity over the right lobe of the liver which appears continuous with right lower lobe lung mass likely metastatic due to local invasion.  Patient was started on IV antibiotics and IV diuresis.  He was gradually able to wean to room air. Patient underwent walk trial to qualify for home oxygen on 12/6 but on 12/7 he decided he did not want to wait for oxygen delivery and wanted to be discharged home immediately. Hospital stay was complicated by difficulty with pain management.  Patient was switched to oral concentrated morphine  on 12/6 and reported this helped significantly.  He has requested a 5-day prescription for this at [facility].  We had extensive discussion regarding the dangers of high-dose opioids mixed with other opioid therapy and benzodiazepines.  He endorses understanding.  He reports that he has planned for a nerve block on Tuesday with the [facility] and at that time we will begin to wean his pain medications. He has close outpatient follow-up with the oncology and pain management team at the [facility].  Acute hypoxic  and hypercapnic respiratory failure - Multifactorial from lung cancer, pleural effusions, CAP - Trilogy machine was recommended in place of his CPAP at home.  TOC was consulted to assist in this arrangement - Patient was able to wean to room air but did demonstrate desaturation when ambulating.  He refuses to wait for her oxygen.  Reports he will stay in his wheelchair  COPD exacerbation Community-acquired pneumonia, unknown organism - Status post 5 days steroids - Status post 5 days ceftriaxone  azithromycin  - Continue bronchodilators  Acute on chronic diastolic CHF - Has been on IV Lasix  - Switch to p.o. dosing at [facility] - Echo: EF 50 to 55%, grade 1 diastolic dysfunction, mild aortic stenosis  Elevated troponin -- peaked at 105, thought to be secondary to demand ischemia  Hypokalemia - Replaced  Acute metabolic encephalopathy, resolved to baseline now  Non-small cell lung cancer - CT here appears that there is also local invasion of the liver - Patient follows outpatient with oncology at [facility] in [place].  He is currently taking pembrolizumab last dose 11/20. 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  Oxycodone  was discontinued, liquid morphine  initiated.  Patient has had greater success with this - Have called in liquid morphine  at DC.  Spoke directly with the pharmacist at [facility] who confirms that they have this medication in stock but only on 30 mL vials.  This  was written for. - Discussed concerns of opioid overdose with patient at DC.  He endorses that he understands that he will discontinue taking his oxycodone  in favor of morphine .  Narcan  also prescribed at [facility] - Recommend close outpatient follow-up with his palliative care team as well  Type 2 diabetes Clotting disorder - Continue home  meds  Generalized weakness - PT/OT recommending DC to SNF but patient is refusing.  He understands that he is at risk for falls at home.  Receives services through the [facility] and will pursue home health through them.  OSA on CPAP - Continue CPAP at home  History of abdominal stents Mesenteric ischemia - Patient reports he is no longer taking Plavix .  Continue with aspirin and Xarelto      Discharge Instructions  Discharge Instructions     Call MD for:  difficulty breathing, headache or visual disturbances   Complete by: As directed    Call MD for:  persistant dizziness or light-headedness   Complete by: As directed    Call MD for:  persistant nausea and vomiting   Complete by: As directed    Call MD for:  severe uncontrolled pain   Complete by: As directed    Call MD for:  temperature >100.4   Complete by: As directed    Diet general   Complete by: As directed    Discharge instructions   Complete by: As directed    While admitted we made some changes to your blood pressure medications. Please review your discharge medications closely to ensure you are taking the correct meds at home. Please take your blood pressure once a day and keep a log.   For Blood Pressure Monitor home BP readings. Goal: 140/90 or less Bring all BP readings with you to your office visits Bring your home BP cuff with you to your office visits  See your primary care doctor in one week to review this log and make further medication changes.   Increase activity slowly   Complete by: As directed    No wound care   Complete by: As directed       Allergies as of 05/21/2024   No Known Allergies      Medication List     PAUSE taking these medications    lisinopril 20 MG tablet Wait to take this until your doctor or other care provider tells you to start again. Commonly known as: ZESTRIL Take 20 mg by mouth daily.       STOP taking these medications    clopidogrel  75 MG tablet Commonly  known as: PLAVIX    oxyCODONE  5 MG immediate release tablet Commonly known as: Oxy IR/ROXICODONE    zolpidem 10 MG tablet Commonly known as: AMBIEN       TAKE these medications    acetaminophen  325 MG tablet Commonly known as: TYLENOL  Take 650 mg by mouth every 6 (six) hours as needed for mild pain (pain score 1-3) or moderate pain (pain score 4-6).   albuterol  108 (90 Base) MCG/ACT inhaler Commonly known as: VENTOLIN  HFA Inhale into the lungs.   aspirin EC 81 MG tablet Take 81 mg by mouth daily. Swallow whole.   atorvastatin  80 MG tablet Commonly known as: LIPITOR  Take 80 mg by mouth daily.   bisacodyl 5 MG EC tablet Commonly known as: DULCOLAX Take 5 mg by mouth 2 (two) times daily as needed for moderate constipation.   DULoxetine  60 MG capsule Commonly known as: CYMBALTA  Take 60 mg by mouth  daily.   Empagliflozin-metFORMIN HCl 12.10-998 MG Tabs Take 2 tablets by mouth every morning.   furosemide  40 MG tablet Commonly known as: Lasix  Take 1 tablet (40 mg total) by mouth daily.   hydrocortisone 2.5 % lotion Apply 1 Application topically 3 (three) times daily as needed (itching).   insulin  aspart 100 UNIT/ML injection Commonly known as: novoLOG  Inject 5 Units into the skin daily as needed for high blood sugar.   insulin  glargine 100 UNIT/ML injection Commonly known as: LANTUS  Inject 30 Units into the skin daily.   lidocaine 5 % Commonly known as: LIDODERM Place 1 patch onto the skin daily.   methadone  5 MG tablet Commonly known as: DOLOPHINE  Take 7.5 mg by mouth 3 (three) times daily.   morphine  CONCENTRATE 10 mg / 0.5 ml concentrated solution Take 0.25 mLs (5 mg total) by mouth every 4 (four) hours as needed for up to 5 days for severe pain (pain score 7-10).   naloxone  4 MG/0.1ML Liqd nasal spray kit Commonly known as: NARCAN  Take incase of overdose   nicotine polacrilex 4 MG lozenge Commonly known as: COMMIT Take 4 mg by mouth as needed for  smoking cessation.   OLANZapine 10 MG tablet Commonly known as: ZYPREXA TAKE ONE-HALF TABLET BY MOUTH ONCE EVERY DAY   ondansetron 8 MG tablet Commonly known as: ZOFRAN Take 8 mg by mouth every 8 (eight) hours as needed.   polyethylene glycol 17 g packet Commonly known as: MIRALAX / GLYCOLAX Take 17 g by mouth.  Take 1 packet (17 g total) by mouth once daily Mix in 4-8ounces of fluid prior to taking.   pregabalin  75 MG capsule Commonly known as: LYRICA  Take 75 mg by mouth daily. Patient has not started medication at this time   prochlorperazine 5 MG tablet Commonly known as: COMPAZINE Take 5 mg by mouth every 8 (eight) hours as needed for nausea.   rivaroxaban  10 MG Tabs tablet Commonly known as: XARELTO  Take 10 mg by mouth daily.   senna 8.6 MG Tabs tablet Commonly known as: SENOKOT Take 1 tablet by mouth.   senna-docusate 8.6-50 MG tablet Commonly known as: Senokot-S Take by mouth.  TAKE 2 TABLETS BY MOUTH TWO TIMES A DAY TO PREVENT CONSTIPATION   Tiotropium Bromide-Olodaterol 2.5-2.5 MCG/ACT Aers Inhale 2 puffs into the lungs daily at 12 noon.   varenicline 1 MG tablet Commonly known as: CHANTIX Take 1 mg by mouth 2 (two) times daily.               Durable Medical Equipment  (From admission, onward)           Start     Ordered   05/21/24 1250  For home use only DME oxygen  Once       Question Answer Comment  Length of Need 12 Months   Mode or (Route) Nasal cannula   Liters per Minute 2   Oxygen delivery system: Gas   Oxygen delivery system: Concentrator      05/21/24 1250            No Known Allergies  Consultations:    Procedures/Studies: ECHOCARDIOGRAM COMPLETE Result Date: 05/17/2024    ECHOCARDIOGRAM REPORT   Patient Name:   Jerome [name] Date of Exam: 05/16/2024 Medical Rec #:  [id number]        Height:       69.0 in Accession #:    [id number]       Weight:  269.0 lb Date of Birth:  January 09, 1961        BSA:          2.343 m  Patient Age:    63 years         BP:           103/54 mmHg Patient Gender: M                HR:           80 bpm. Exam Location:  [facility] Procedure: 2D Echo, Cardiac Doppler and Color Doppler (Both Spectral and Color            Flow Doppler were utilized during procedure). Indications:     I50.31 Acute Diastolic CHF  History:         Patient has no prior history of Echocardiogram examinations.                  COPD; Risk Factors:Hypertension, Diabetes, Dyslipidemia and                  Sleep Apnea.  Sonographer:     Carl Coma RDCS Referring Phys:  [id number] MURVIN MANA Diagnosing Phys: Evalene Lunger MD  Sonographer Comments: Technically difficult study due to poor echo windows. IMPRESSIONS  1. Left ventricular ejection fraction, by estimation, is 50 to 55%. The left ventricle has low normal function. The left ventricle has no regional wall motion abnormalities. Left ventricular diastolic parameters are consistent with Grade I diastolic dysfunction (impaired relaxation).  2. Right ventricular systolic function is mildly reduced. The right ventricular size is mildly enlarged. There is normal pulmonary artery systolic pressure. The estimated right ventricular systolic pressure is 32.7 mmHg.  3. The mitral valve is normal in structure. No evidence of mitral valve regurgitation. No evidence of mitral stenosis.  4. The aortic valve is calcified. Aortic valve regurgitation is not visualized. Mild aortic valve stenosis. Aortic valve area, by VTI measures 1.12 cm. Aortic valve mean gradient measures 11.8 mmHg. Aortic valve Vmax measures 2.31 m/s.  5. The inferior vena cava is normal in size with <50% respiratory variability, suggesting right atrial pressure of 8 mmHg. FINDINGS  Left Ventricle: Left ventricular ejection fraction, by estimation, is 50 to 55%. The left ventricle has low normal function. The left ventricle has no regional wall motion abnormalities. Strain was performed and the global longitudinal  strain is indeterminate. The left ventricular internal cavity size was normal in size. There is no left ventricular hypertrophy. Left ventricular diastolic parameters are consistent with Grade I diastolic dysfunction (impaired relaxation). Right Ventricle: The right ventricular size is mildly enlarged. No increase in right ventricular wall thickness. Right ventricular systolic function is mildly reduced. There is normal pulmonary artery systolic pressure. The tricuspid regurgitant velocity  is 2.63 m/s, and with an assumed right atrial pressure of 5 mmHg, the estimated right ventricular systolic pressure is 32.7 mmHg. Left Atrium: Left atrial size was normal in size. Right Atrium: Right atrial size was normal in size. Pericardium: Trivial pericardial effusion is present. Mitral Valve: The mitral valve is normal in structure. No evidence of mitral valve regurgitation. No evidence of mitral valve stenosis. Tricuspid Valve: The tricuspid valve is normal in structure. Tricuspid valve regurgitation is mild . No evidence of tricuspid stenosis. The aortic valve is calcified. Aortic valve regurgitation is not visualized. Mild aortic stenosis is present. Pulmonic Valve: The pulmonic valve was normal in structure. Pulmonic valve regurgitation is not visualized. No evidence of pulmonic stenosis.  Aorta: The aortic root is normal in size and structure. Venous: The inferior vena cava is normal in size with less than 50% respiratory variability, suggesting right atrial pressure of 8 mmHg. IAS/Shunts: No atrial level shunt detected by color flow Doppler. Additional Comments: 3D was performed not requiring image post processing on an independent workstation and was indeterminate.  LEFT VENTRICLE PLAX 2D LVIDd:         5.30 cm   Diastology LVIDs:         3.80 cm   LV e' medial:    8.78 cm/s LV PW:         0.90 cm   LV E/e' medial:  11.3 LV IVS:        0.70 cm   LV e' lateral:   9.89 cm/s LVOT diam:     2.10 cm   LV E/e' lateral: 10.0  LV SV:         52 LV SV Index:   22 LVOT Area:     3.46 cm  RIGHT VENTRICLE             IVC RV Basal diam:  4.00 cm     IVC diam: 2.80 cm RV S prime:     11.02 cm/s TAPSE (M-mode): 2.3 cm LEFT ATRIUM             Index        RIGHT ATRIUM           Index LA diam:        5.30 cm 2.26 cm/m   RA Area:     19.10 cm LA Vol (A2C):   84.5 ml 36.06 ml/m  RA Volume:   60.00 ml  25.61 ml/m LA Vol (A4C):   40.7 ml 17.37 ml/m LA Biplane Vol: 62.3 ml 26.59 ml/m  AORTIC VALVE AV Area (Vmax):    1.11 cm AV Area (Vmean):   1.08 cm AV Area (VTI):     1.12 cm AV Vmax:           231.00 cm/s AV Vmean:          159.200 cm/s AV VTI:            0.462 m AV Peak Grad:      21.3 mmHg AV Mean Grad:      11.8 mmHg LVOT Vmax:         74.15 cm/s LVOT Vmean:        49.500 cm/s LVOT VTI:          0.150 m LVOT/AV VTI ratio: 0.32  AORTA Ao Root diam: 3.40 cm Ao Asc diam:  3.60 cm MITRAL VALVE               TRICUSPID VALVE MV Area (PHT): 4.07 cm    TR Peak grad:   27.7 mmHg MV Decel Time: 187 msec    TR Vmax:        263.00 cm/s MV E velocity: 99.05 cm/s MV A velocity: 89.10 cm/s  SHUNTS MV E/A ratio:  1.11        Systemic VTI:  0.15 m                            Systemic Diam: 2.10 cm Evalene Lunger MD Electronically signed by Evalene Lunger MD Signature Date/Time: 05/17/2024/8:10:12 AM    Final    CT Angio Chest PE W and/or Wo Contrast Result Date: 05/16/2024 CLINICAL  DATA:  Fall transferring from bed to wheelchair. Syncopal episode. EXAM: CT ANGIOGRAPHY CHEST CT ABDOMEN AND PELVIS WITH CONTRAST TECHNIQUE: Multidetector CT imaging of the chest was performed using the standard protocol during bolus administration of intravenous contrast. Multiplanar CT image reconstructions and MIPs were obtained to evaluate the vascular anatomy. Multidetector CT imaging of the abdomen and pelvis was performed using the standard protocol during bolus administration of intravenous contrast. RADIATION DOSE REDUCTION: This exam was performed according to the  departmental dose-optimization program which includes automated exposure control, adjustment of the mA and/or kV according to patient size and/or use of iterative reconstruction technique. CONTRAST:  OMNIPAQUE  IOHEXOL  350 MG/ML SOLN COMPARISON:  CT abdomen/pelvis 10/04/2020 FINDINGS: CTA CHEST FINDINGS Cardiovascular: Mild-to-moderate cardiomegaly. Minimal calcified plaque over the left main and 3 vessel coronary arteries. Thoracic aorta is normal in caliber. There is mild calcified plaque over the descending thoracic aorta. Pulmonary arterial system is adequately opacified without evidence of emboli. Remaining vascular structures are unremarkable. Mediastinum/Nodes: 1.3 cm right paratracheal lymph node. Possible 1.3 cm right hilar lymph node. Few other smaller shotty mediastinal lymph nodes. Remaining mediastinal structures are unremarkable. Lungs/Pleura: Lungs are adequately inflated. Masslike density abutting the pleura over the right lower lobe laterally measuring 4.2 x 5.6 cm. Mild linear scarring/atelectasis adjacent the right minor fissure with associated 2.2 x 2.3 cm nodular density over the posterolateral right upper lobe adjacent the major fissure. Minimal dependent atelectasis over the left base. No effusion. Airways are normal. Musculoskeletal: No focal abnormality. Review of the MIP images confirms the above findings. CT ABDOMEN and PELVIS FINDINGS Hepatobiliary: 2.5 cm hypodensity over the dome of the right lobe of the liver previous to be continuous with the right lower lobe lung mass may be due to local invasion. Liver is otherwise unremarkable. Gallbladder and biliary tree are normal. Pancreas: Normal. Spleen: Normal. Adrenals/Urinary Tract: Adrenal glands are normal. Kidneys are normal in size without definite hydronephrosis or nephrolithiasis. Ureters and bladder are normal. Stomach/Bowel: Stomach and small bowel are normal. Appendix is normal. Colon is unremarkable. Vascular/Lymphatic:  Moderate calcified plaque over the abdominal aorta which is normal in caliber. No evidence of adenopathy. Reproductive: Prostate is unremarkable. Other: No free peritoneal fluid or focal inflammatory change. Musculoskeletal: No focal abnormality. Review of the MIP images confirms the above findings. IMPRESSION: 1. No evidence of pulmonary embolism. 2. 4.2 x 5.6 cm mass abutting the pleura over the right lower lobe laterally with adjacent 2.2 x 2.3 cm nodular density over the posterolateral right upper lobe adjacent the major fissure. 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 Recommend referral to [facility]) for consideration of tissue sampling. 3. 2.5 cm hypodensity over the dome of the right lobe of the liver which appears to be continuous with the right lower lobe lung mass likely metastatic disease due to local invasion. 4. Mild-to-moderate cardiomegaly. Atherosclerotic coronary artery disease. 5. Aortic atherosclerosis. 6. No acute findings in the abdomen/pelvis. Aortic Atherosclerosis (ICD10-I70.0). Electronically Signed   By: Toribio Agreste M.D.   On: 05/16/2024 13:59   CT ABDOMEN PELVIS W CONTRAST Result Date: 05/16/2024 CLINICAL DATA:  Fall transferring from bed to wheelchair. Syncopal episode. EXAM: CT ANGIOGRAPHY CHEST CT ABDOMEN AND PELVIS WITH CONTRAST TECHNIQUE: Multidetector CT imaging of the chest was performed using the standard protocol during bolus administration of intravenous contrast. Multiplanar CT image reconstructions and MIPs were obtained to evaluate the vascular anatomy. Multidetector CT imaging of the abdomen and pelvis was performed using the standard  protocol during bolus administration of intravenous contrast. RADIATION DOSE REDUCTION: This exam was performed according to the departmental dose-optimization program which includes automated exposure control, adjustment of the mA and/or kV according to patient size and/or use  of iterative reconstruction technique. CONTRAST:  OMNIPAQUE  IOHEXOL  350 MG/ML SOLN COMPARISON:  CT abdomen/pelvis 10/04/2020 FINDINGS: CTA CHEST FINDINGS Cardiovascular: Mild-to-moderate cardiomegaly. Minimal calcified plaque over the left main and 3 vessel coronary arteries. Thoracic aorta is normal in caliber. There is mild calcified plaque over the descending thoracic aorta. Pulmonary arterial system is adequately opacified without evidence of emboli. Remaining vascular structures are unremarkable. Mediastinum/Nodes: 1.3 cm right paratracheal lymph node. Possible 1.3 cm right hilar lymph node. Few other smaller shotty mediastinal lymph nodes. Remaining mediastinal structures are unremarkable. Lungs/Pleura: Lungs are adequately inflated. Masslike density abutting the pleura over the right lower lobe laterally measuring 4.2 x 5.6 cm. Mild linear scarring/atelectasis adjacent the right minor fissure with associated 2.2 x 2.3 cm nodular density over the posterolateral right upper lobe adjacent the major fissure. Minimal dependent atelectasis over the left base. No effusion. Airways are normal. Musculoskeletal: No focal abnormality. Review of the MIP images confirms the above findings. CT ABDOMEN and PELVIS FINDINGS Hepatobiliary: 2.5 cm hypodensity over the dome of the right lobe of the liver previous to be continuous with the right lower lobe lung mass may be due to local invasion. Liver is otherwise unremarkable. Gallbladder and biliary tree are normal. Pancreas: Normal. Spleen: Normal. Adrenals/Urinary Tract: Adrenal glands are normal. Kidneys are normal in size without definite hydronephrosis or nephrolithiasis. Ureters and bladder are normal. Stomach/Bowel: Stomach and small bowel are normal. Appendix is normal. Colon is unremarkable. Vascular/Lymphatic: Moderate calcified plaque over the abdominal aorta which is normal in caliber. No evidence of adenopathy. Reproductive: Prostate is unremarkable. Other:  No free peritoneal fluid or focal inflammatory change. Musculoskeletal: No focal abnormality. Review of the MIP images confirms the above findings. IMPRESSION: 1. No evidence of pulmonary embolism. 2. 4.2 x 5.6 cm mass abutting the pleura over the right lower lobe laterally with adjacent 2.2 x 2.3 cm nodular density over the posterolateral right upper lobe adjacent the major fissure. 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 Recommend referral to [facility]) for consideration of tissue sampling. 3. 2.5 cm hypodensity over the dome of the right lobe of the liver which appears to be continuous with the right lower lobe lung mass likely metastatic disease due to local invasion. 4. Mild-to-moderate cardiomegaly. Atherosclerotic coronary artery disease. 5. Aortic atherosclerosis. 6. No acute findings in the abdomen/pelvis. Aortic Atherosclerosis (ICD10-I70.0). Electronically Signed   By: Toribio Agreste M.D.   On: 05/16/2024 13:59   CT HEAD WO CONTRAST ( ) Result Date: 05/16/2024 EXAM: CT HEAD AND CERVICAL SPINE 05/16/2024 01:05:50 PM TECHNIQUE: CT of the head and cervical spine was performed without the administration of intravenous contrast. Multiplanar reformatted images are provided for review. Automated exposure control, iterative reconstruction, and/or weight based adjustment of the mA/kV was utilized to reduce the radiation dose to as low as reasonably achievable. COMPARISON: CT Neck November 16, 2016 CLINICAL HISTORY: Headache, new onset (Age >= 51y) FINDINGS: CT HEAD BRAIN AND VENTRICLES: No acute intracranial hemorrhage. No mass effect or midline shift. No abnormal extra-axial fluid collection. No evidence of acute infarct. No hydrocephalus. Patchy white matter hypodensities, nonspecific but compatible with chronic microvascular ischemic change. ORBITS: No acute abnormality. SINUSES AND MASTOIDS: Right maxillary sinus air-fluid level.  Ethmoid air cell  mucosal thickening. Chronically  opacified right mastoid air cells with soft tissue and/or fluid andthinning or dehiscence of the adjacent tegmen. SImilar findings were present on 2018 CT neck. SOFT TISSUES AND SKULL: No acute skull fracture. No acute soft tissue abnormality. CT CERVICAL SPINE BONES AND ALIGNMENT: No acute fracture or traumatic malalignment. DEGENERATIVE CHANGES: No significant degenerative changes. SOFT TISSUES: No prevertebral soft tissue swelling. IMPRESSION: 1. No acute intracranial abnormality. 2. No acute fracture or traumatic malalignment of the cervical spine. 3. Chronically opacified right mastoid air cells with soft tissue and/or fluid and thinning or dehiscence of the adjacent tegmen. Similar findings were present on 2018 CT neck. An MRI with contrast could further evaluate if clinically warranted. Electronically signed by: Gilmore Molt MD 05/16/2024 01:23 PM EST RP Workstation: [id number]   CT Cervical Spine Wo Contrast Result Date: 05/16/2024 EXAM: CT HEAD AND CERVICAL SPINE 05/16/2024 01:05:50 PM TECHNIQUE: CT of the head and cervical spine was performed without the administration of intravenous contrast. Multiplanar reformatted images are provided for review. Automated exposure control, iterative reconstruction, and/or weight based adjustment of the mA/kV was utilized to reduce the radiation dose to as low as reasonably achievable. COMPARISON: CT Neck November 16, 2016 CLINICAL HISTORY: Headache, new onset (Age >= 51y) FINDINGS: CT HEAD BRAIN AND VENTRICLES: No acute intracranial hemorrhage. No mass effect or midline shift. No abnormal extra-axial fluid collection. No evidence of acute infarct. No hydrocephalus. Patchy white matter hypodensities, nonspecific but compatible with chronic microvascular ischemic change. ORBITS: No acute abnormality. SINUSES AND MASTOIDS: Right maxillary sinus air-fluid level.  Ethmoid air cell mucosal thickening. Chronically opacified right mastoid air  cells with soft tissue and/or fluid andthinning or dehiscence of the adjacent tegmen. SImilar findings were present on 2018 CT neck. SOFT TISSUES AND SKULL: No acute skull fracture. No acute soft tissue abnormality. CT CERVICAL SPINE BONES AND ALIGNMENT: No acute fracture or traumatic malalignment. DEGENERATIVE CHANGES: No significant degenerative changes. SOFT TISSUES: No prevertebral soft tissue swelling. IMPRESSION: 1. No acute intracranial abnormality. 2. No acute fracture or traumatic malalignment of the cervical spine. 3. Chronically opacified right mastoid air cells with soft tissue and/or fluid and thinning or dehiscence of the adjacent tegmen. Similar findings were present on 2018 CT neck. An MRI with contrast could further evaluate if clinically warranted. Electronically signed by: Gilmore Molt MD 05/16/2024 01:23 PM EST RP Workstation: [id number]   DG Chest Port 1 View if patient is in a treatment room. Result Date: 05/16/2024 CLINICAL DATA:  Shortness of breath.  Suspected sepsis. EXAM: PORTABLE CHEST 1 VIEW COMPARISON:  11/16/2016 FINDINGS: Interval enlarged cardiac silhouette, accentuated by a poor inspiration and the portable AP technique. Aortic arch atheromatous calcifications. Increased prominence of the pulmonary vasculature. Stable mild chronic interstitial prominence and mild peribronchial thickening. The thickness of the soft tissues overlying the lower chest make it difficult to assess for airspace opacity and pleural fluid. There is some ill-defined increased density at both lung bases laterally, greater on the right, with evidence of a small right pleural effusion. Unremarkable bones. IMPRESSION: 1. Possible bibasilar atelectasis or pneumonia and small right pleural effusion. Recommend PA and lateral views with a better inspiration when possible. 2. Interval cardiomegaly and pulmonary vascular congestion, accentuated by a poor inspiration. 3. Stable mild chronic interstitial lung  disease and mild chronic bronchitic changes. Electronically Signed   By: Elspeth Bathe M.D.   On: 05/16/2024 10:57      Discharge Exam: Vitals:   05/21/24 0804 05/21/24 1228  BP: 121/66 125/67  Pulse: 71  85  Resp: 18 20  Temp: (!) 97.5 F (36.4 C) 97.6 F (36.4 C)  SpO2: 99% 90%   Vitals:   05/21/24 0455 05/21/24 0804 05/21/24 0804 05/21/24 1228  BP:   121/66 125/67  Pulse:   71 85  Resp:   18 20  Temp:   (!) 97.5 F (36.4 C) 97.6 F (36.4 C)  TempSrc:      SpO2:  93% 99% 90%  Weight: 118.4 kg     Height:        Constitutional:  Normal appearance. Non toxic-appearing.  HENT: Head Normocephalic and atraumatic.  Mucous membranes are moist.  Eyes:  Extraocular intact. Conjunctivae normal.  Cardiovascular: Rate and Rhythm: Normal rate and regular rhythm.  Pulmonary: Non labored, symmetric rise of chest wall.  Skin: warm and dry. not jaundiced.  Neurological: No focal deficit present. alert. Oriented.  Psychiatric: Mood and Affect congruent.    The results of significant diagnostics from this hospitalization (including imaging, microbiology, ancillary and laboratory) are listed below for reference.     Microbiology: Recent Results (from the past 240 hours)  Culture, blood (Routine x 2)     Status: None   Collection Time: 05/16/24 10:14 AM   Specimen: BLOOD  Result Value Ref Range Status   Specimen Description BLOOD BLOOD RIGHT ARM  Final   Special Requests   Final    BOTTLES DRAWN AEROBIC AND ANAEROBIC Blood Culture results may not be optimal due to an inadequate volume of blood received in culture bottles   Culture   Final    NO GROWTH 5 DAYS Performed at [facility], [address] [place], [place] [address]    Report Status 05/21/2024 FINAL  Final  Resp panel by RT-PCR (RSV, Flu A&B, Covid) Anterior Nasal Swab     Status: None   Collection Time: 05/16/24 10:19 AM   Specimen: Anterior Nasal Swab  Result Value Ref Range Status   SARS Coronavirus 2  by RT PCR NEGATIVE NEGATIVE Final    Comment: (NOTE) SARS-CoV-2 target nucleic acids are NOT DETECTED.  The SARS-CoV-2 RNA is generally detectable in upper respiratory specimens during the acute phase of infection. The lowest concentration of SARS-CoV-2 viral copies this assay can detect is 138 copies/mL. A negative result does not preclude SARS-Cov-2 infection and should not be used as the sole basis for treatment or other patient management decisions. A negative result may occur with  improper specimen collection/handling, submission of specimen other than nasopharyngeal swab, presence of viral mutation(s) within the areas targeted by this assay, and inadequate number of viral copies(<138 copies/mL). A negative result must be combined with clinical observations, patient history, and epidemiological information. The expected result is Negative.  Fact Sheet for Patients:  bloggercourse.com  Fact Sheet for Healthcare Providers:  seriousbroker.it  This test is no t yet approved or cleared by the [place]  FDA and  has been authorized for detection and/or diagnosis of SARS-CoV-2 by FDA under an Emergency Use Authorization (EUA). This EUA will remain  in effect (meaning this test can be used) for the duration of the COVID-19 declaration under Section 564(b)(1) of the Act, 21 U.S.C.section 360bbb-3(b)(1), unless the authorization is terminated  or revoked sooner.       Influenza A by PCR NEGATIVE NEGATIVE Final   Influenza B by PCR NEGATIVE NEGATIVE Final    Comment: (NOTE) The Xpert Xpress SARS-CoV-2/FLU/RSV plus assay is intended as an aid in the diagnosis of influenza from Nasopharyngeal swab specimens  and should not be used as a sole basis for treatment. Nasal washings and aspirates are unacceptable for Xpert Xpress SARS-CoV-2/FLU/RSV testing.  Fact Sheet for Patients: bloggercourse.com  Fact  Sheet for Healthcare Providers: seriousbroker.it  This test is not yet approved or cleared by the [place]  FDA and has been authorized for detection and/or diagnosis of SARS-CoV-2 by FDA under an Emergency Use Authorization (EUA). This EUA will remain in effect (meaning this test can be used) for the duration of the COVID-19 declaration under Section 564(b)(1) of the Act, 21 U.S.C. section 360bbb-3(b)(1), unless the authorization is terminated or revoked.     Resp Syncytial Virus by PCR NEGATIVE NEGATIVE Final    Comment: (NOTE) Fact Sheet for Patients: bloggercourse.com  Fact Sheet for Healthcare Providers: seriousbroker.it  This test is not yet approved or cleared by the [place]  FDA and has been authorized for detection and/or diagnosis of SARS-CoV-2 by FDA under an Emergency Use Authorization (EUA). This EUA will remain in effect (meaning this test can be used) for the duration of the COVID-19 declaration under Section 564(b)(1) of the Act, 21 U.S.C. section 360bbb-3(b)(1), unless the authorization is terminated or revoked.  Performed at [facility], [address] [place], [place] [address]   Respiratory (~20 pathogens) panel by PCR     Status: None   Collection Time: 05/16/24  8:16 PM   Specimen: Nasopharyngeal Swab; Respiratory  Result Value Ref Range Status   Adenovirus NOT DETECTED NOT DETECTED Final   Coronavirus 229E NOT DETECTED NOT DETECTED Final    Comment: (NOTE) The Coronavirus on the Respiratory Panel, DOES NOT test for the novel  Coronavirus (2019 nCoV)    Coronavirus HKU1 NOT DETECTED NOT DETECTED Final   Coronavirus NL63 NOT DETECTED NOT DETECTED Final   Coronavirus OC43 NOT DETECTED NOT DETECTED Final   Metapneumovirus NOT DETECTED NOT DETECTED Final   Rhinovirus / Enterovirus NOT DETECTED NOT DETECTED Final   Influenza A NOT DETECTED NOT DETECTED Final    Influenza B NOT DETECTED NOT DETECTED Final   Parainfluenza Virus 1 NOT DETECTED NOT DETECTED Final   Parainfluenza Virus 2 NOT DETECTED NOT DETECTED Final   Parainfluenza Virus 3 NOT DETECTED NOT DETECTED Final   Parainfluenza Virus 4 NOT DETECTED NOT DETECTED Final   Respiratory Syncytial Virus NOT DETECTED NOT DETECTED Final   Bordetella pertussis NOT DETECTED NOT DETECTED Final   Bordetella Parapertussis NOT DETECTED NOT DETECTED Final   Chlamydophila pneumoniae NOT DETECTED NOT DETECTED Final   Mycoplasma pneumoniae NOT DETECTED NOT DETECTED Final    Comment: Performed at [facility], [address] [place], [place] [address]  Culture, blood (Routine X 2) w Reflex to ID Panel     Status: None (Preliminary result)   Collection Time: 05/17/24  2:07 AM   Specimen: BLOOD  Result Value Ref Range Status   Specimen Description BLOOD RIGHT ANTECUBITAL  Final   Special Requests   Final    BOTTLES DRAWN AEROBIC AND ANAEROBIC Blood Culture adequate volume   Culture   Final    NO GROWTH 4 DAYS Performed at [facility], [address] [place], [place] [address]    Report Status PENDING  Incomplete     Labs: BNP (last 3 results) No results for input(s): BNP in the last 8760 hours. Basic Metabolic Panel: Recent Labs  Lab 05/17/24 0207 05/18/24 0357 05/19/24 0409 05/20/24 0431 05/21/24 0508  NA 135 132* 135 136 135  K 3.4* 3.6 3.9  3.7 3.8  CL 92* 91* 95* 94* 94*  CO2 36* 35* 35* 36* 34*  GLUCOSE 244* 109* 126* 103* 96  BUN 17 16 16 17 15   CREATININE 0.48* 0.39* 0.46* 0.45* 0.37*  CALCIUM  8.5* 8.9 9.0 9.1 9.1  MG 1.9 2.0 2.0 1.9 1.9  PHOS  --   --  3.6  --  3.2   Liver Function Tests: Recent Labs  Lab 05/16/24 1012 05/19/24 0409 05/21/24 0508  AST 55*  --   --   ALT 22  --   --   ALKPHOS 99  --   --   BILITOT 0.7  --   --   PROT 6.8  --   --   ALBUMIN 2.6* 2.7* 2.7*   No results for input(s): LIPASE, AMYLASE in the last 168 hours. No results  for input(s): AMMONIA in the last 168 hours. CBC: Recent Labs  Lab 05/16/24 1012 05/18/24 0357 05/20/24 0431  WBC 12.5* 9.0 7.8  NEUTROABS 10.7*  --   --   HGB 14.2 13.7 14.9  HCT 47.2 45.8 50.8  MCV 85.0 85.4 85.1  PLT 274 235 250   Cardiac Enzymes: No results for input(s): CKTOTAL, CKMB, CKMBINDEX, TROPONINI in the last 168 hours. BNP: Invalid input(s): POCBNP CBG: Recent Labs  Lab 05/20/24 1158 05/20/24 1638 05/20/24 2101 05/21/24 0806 05/21/24 1211  GLUCAP 206* 192* 181* 95 268*   D-Dimer No results for input(s): DDIMER in the last 72 hours. Hgb A1c No results for input(s): HGBA1C in the last 72 hours. Lipid Profile No results for input(s): CHOL, HDL, LDLCALC, TRIG, CHOLHDL, LDLDIRECT in the last 72 hours. Thyroid function studies No results for input(s): TSH, T4TOTAL, T3FREE, THYROIDAB in the last 72 hours.  Invalid input(s): FREET3 Anemia work up No results for input(s): VITAMINB12, FOLATE, FERRITIN, TIBC, IRON, RETICCTPCT in the last 72 hours. Urinalysis    Component Value Date/Time   COLORURINE YELLOW (A) 05/16/2024 1650   APPEARANCEUR CLEAR (A) 05/16/2024 1650   LABSPEC 1.038 (H) 05/16/2024 1650   PHURINE 5.0 05/16/2024 1650   GLUCOSEU >=500 (A) 05/16/2024 1650   HGBUR NEGATIVE 05/16/2024 1650   BILIRUBINUR NEGATIVE 05/16/2024 1650   KETONESUR NEGATIVE 05/16/2024 1650   PROTEINUR >=300 (A) 05/16/2024 1650   NITRITE NEGATIVE 05/16/2024 1650   LEUKOCYTESUR NEGATIVE 05/16/2024 1650   Sepsis Labs Recent Labs  Lab 05/16/24 1012 05/18/24 0357 05/20/24 0431  WBC 12.5* 9.0 7.8   Microbiology Recent Results (from the past 240 hours)  Culture, blood (Routine x 2)     Status: None   Collection Time: 05/16/24 10:14 AM   Specimen: BLOOD  Result Value Ref Range Status   Specimen Description BLOOD BLOOD RIGHT ARM  Final   Special Requests   Final    BOTTLES DRAWN AEROBIC AND ANAEROBIC Blood Culture  results may not be optimal due to an inadequate volume of blood received in culture bottles   Culture   Final    NO GROWTH 5 DAYS Performed at [facility], [address] [place], [place] [address]    Report Status 05/21/2024 FINAL  Final  Resp panel by RT-PCR (RSV, Flu A&B, Covid) Anterior Nasal Swab     Status: None   Collection Time: 05/16/24 10:19 AM   Specimen: Anterior Nasal Swab  Result Value Ref Range Status   SARS Coronavirus 2 by RT PCR NEGATIVE NEGATIVE Final    Comment: (NOTE) SARS-CoV-2 target nucleic acids are NOT DETECTED.  The SARS-CoV-2 RNA is  generally detectable in upper respiratory specimens during the acute phase of infection. The lowest concentration of SARS-CoV-2 viral copies this assay can detect is 138 copies/mL. A negative result does not preclude SARS-Cov-2 infection and should not be used as the sole basis for treatment or other patient management decisions. A negative result may occur with  improper specimen collection/handling, submission of specimen other than nasopharyngeal swab, presence of viral mutation(s) within the areas targeted by this assay, and inadequate number of viral copies(<138 copies/mL). A negative result must be combined with clinical observations, patient history, and epidemiological information. The expected result is Negative.  Fact Sheet for Patients:  bloggercourse.com  Fact Sheet for Healthcare Providers:  seriousbroker.it  This test is no t yet approved or cleared by the [place]  FDA and  has been authorized for detection and/or diagnosis of SARS-CoV-2 by FDA under an Emergency Use Authorization (EUA). This EUA will remain  in effect (meaning this test can be used) for the duration of the COVID-19 declaration under Section 564(b)(1) of the Act, 21 U.S.C.section 360bbb-3(b)(1), unless the authorization is terminated  or revoked sooner.       Influenza A  by PCR NEGATIVE NEGATIVE Final   Influenza B by PCR NEGATIVE NEGATIVE Final    Comment: (NOTE) The Xpert Xpress SARS-CoV-2/FLU/RSV plus assay is intended as an aid in the diagnosis of influenza from Nasopharyngeal swab specimens and should not be used as a sole basis for treatment. Nasal washings and aspirates are unacceptable for Xpert Xpress SARS-CoV-2/FLU/RSV testing.  Fact Sheet for Patients: bloggercourse.com  Fact Sheet for Healthcare Providers: seriousbroker.it  This test is not yet approved or cleared by the [place]  FDA and has been authorized for detection and/or diagnosis of SARS-CoV-2 by FDA under an Emergency Use Authorization (EUA). This EUA will remain in effect (meaning this test can be used) for the duration of the COVID-19 declaration under Section 564(b)(1) of the Act, 21 U.S.C. section 360bbb-3(b)(1), unless the authorization is terminated or revoked.     Resp Syncytial Virus by PCR NEGATIVE NEGATIVE Final    Comment: (NOTE) Fact Sheet for Patients: bloggercourse.com  Fact Sheet for Healthcare Providers: seriousbroker.it  This test is not yet approved or cleared by the [place]  FDA and has been authorized for detection and/or diagnosis of SARS-CoV-2 by FDA under an Emergency Use Authorization (EUA). This EUA will remain in effect (meaning this test can be used) for the duration of the COVID-19 declaration under Section 564(b)(1) of the Act, 21 U.S.C. section 360bbb-3(b)(1), unless the authorization is terminated or revoked.  Performed at [facility], [address] [place], [place] [address]   Respiratory (~20 pathogens) panel by PCR     Status: None   Collection Time: 05/16/24  8:16 PM   Specimen: Nasopharyngeal Swab; Respiratory  Result Value Ref Range Status   Adenovirus NOT DETECTED NOT DETECTED Final   Coronavirus 229E NOT  DETECTED NOT DETECTED Final    Comment: (NOTE) The Coronavirus on the Respiratory Panel, DOES NOT test for the novel  Coronavirus (2019 nCoV)    Coronavirus HKU1 NOT DETECTED NOT DETECTED Final   Coronavirus NL63 NOT DETECTED NOT DETECTED Final   Coronavirus OC43 NOT DETECTED NOT DETECTED Final   Metapneumovirus NOT DETECTED NOT DETECTED Final   Rhinovirus / Enterovirus NOT DETECTED NOT DETECTED Final   Influenza A NOT DETECTED NOT DETECTED Final   Influenza B NOT DETECTED NOT DETECTED Final   Parainfluenza Virus 1 NOT DETECTED NOT DETECTED Final  Parainfluenza Virus 2 NOT DETECTED NOT DETECTED Final   Parainfluenza Virus 3 NOT DETECTED NOT DETECTED Final   Parainfluenza Virus 4 NOT DETECTED NOT DETECTED Final   Respiratory Syncytial Virus NOT DETECTED NOT DETECTED Final   Bordetella pertussis NOT DETECTED NOT DETECTED Final   Bordetella Parapertussis NOT DETECTED NOT DETECTED Final   Chlamydophila pneumoniae NOT DETECTED NOT DETECTED Final   Mycoplasma pneumoniae NOT DETECTED NOT DETECTED Final    Comment: Performed at [facility], [address], [place], [place] [address]  Culture, blood (Routine X 2) w Reflex to ID Panel     Status: None (Preliminary result)   Collection Time: 05/17/24  2:07 AM   Specimen: BLOOD  Result Value Ref Range Status   Specimen Description BLOOD RIGHT ANTECUBITAL  Final   Special Requests   Final    BOTTLES DRAWN AEROBIC AND ANAEROBIC Blood Culture adequate volume   Culture   Final    NO GROWTH 4 DAYS Performed at [facility] [address] [place], [place] [address]    Report Status PENDING  Incomplete     Time coordinating discharge: 32 min   SIGNED: Markeis Allman, DO [facility] 05/21/2024, 3:26 PM Pager   If 7PM-7AM, please contact night-coverage

## 2024-05-22 LAB — CULTURE, BLOOD (ROUTINE X 2)
Culture: NO GROWTH
Special Requests: ADEQUATE

## 2024-07-16 DEATH — deceased
